# Patient Record
Sex: Male | Born: 1995 | Race: White | Hispanic: No | Marital: Single | Smoking: Never smoker
Health system: Southern US, Community
[De-identification: ages and names within clinical notes are randomized; demographics above are authoritative.]

## PROBLEM LIST (undated history)

## (undated) DIAGNOSIS — F913 Oppositional defiant disorder: Secondary | ICD-10-CM

## (undated) DIAGNOSIS — F909 Attention-deficit hyperactivity disorder, unspecified type: Secondary | ICD-10-CM

## (undated) DIAGNOSIS — F32A Depression, unspecified: Secondary | ICD-10-CM

## (undated) DIAGNOSIS — F329 Major depressive disorder, single episode, unspecified: Secondary | ICD-10-CM

## (undated) DIAGNOSIS — G43909 Migraine, unspecified, not intractable, without status migrainosus: Secondary | ICD-10-CM

## (undated) DIAGNOSIS — Z8619 Personal history of other infectious and parasitic diseases: Secondary | ICD-10-CM

## (undated) DIAGNOSIS — D649 Anemia, unspecified: Secondary | ICD-10-CM

## (undated) DIAGNOSIS — F845 Asperger's syndrome: Secondary | ICD-10-CM

## (undated) HISTORY — DX: Major depressive disorder, single episode, unspecified: F32.9

## (undated) HISTORY — DX: Attention-deficit hyperactivity disorder, unspecified type: F90.9

## (undated) HISTORY — DX: Asperger's syndrome: F84.5

## (undated) HISTORY — DX: Migraine, unspecified, not intractable, without status migrainosus: G43.909

## (undated) HISTORY — PX: OTHER SURGICAL HISTORY: SHX169

## (undated) HISTORY — PX: TONSILLECTOMY: SUR1361

## (undated) HISTORY — PX: WISDOM TOOTH EXTRACTION: SHX21

## (undated) HISTORY — DX: Depression, unspecified: F32.A

## (undated) HISTORY — DX: Anemia, unspecified: D64.9

## (undated) HISTORY — DX: Oppositional defiant disorder: F91.3

## (undated) HISTORY — DX: Personal history of other infectious and parasitic diseases: Z86.19

---
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
## 2015-03-09 ENCOUNTER — Ambulatory Visit (HOSPITAL_COMMUNITY): Payer: Self-pay | Admitting: Psychiatry

## 2015-03-24 ENCOUNTER — Ambulatory Visit (INDEPENDENT_AMBULATORY_CARE_PROVIDER_SITE_OTHER): Payer: BLUE CROSS/BLUE SHIELD | Admitting: Psychiatry

## 2015-03-24 ENCOUNTER — Encounter (HOSPITAL_COMMUNITY): Payer: Self-pay | Admitting: Psychiatry

## 2015-03-24 VITALS — BP 98/58 | HR 84 | Ht 65.0 in | Wt 114.0 lb

## 2015-03-24 DIAGNOSIS — F913 Oppositional defiant disorder: Secondary | ICD-10-CM | POA: Diagnosis not present

## 2015-03-24 DIAGNOSIS — F909 Attention-deficit hyperactivity disorder, unspecified type: Secondary | ICD-10-CM

## 2015-03-24 DIAGNOSIS — F845 Asperger's syndrome: Secondary | ICD-10-CM | POA: Diagnosis not present

## 2015-03-24 DIAGNOSIS — F988 Other specified behavioral and emotional disorders with onset usually occurring in childhood and adolescence: Secondary | ICD-10-CM

## 2015-03-24 MED ORDER — AMPHETAMINE-DEXTROAMPHET ER 25 MG PO CP24: 50.0000 mg | ORAL_CAPSULE | ORAL | Status: DC

## 2015-03-24 MED ORDER — ESCITALOPRAM OXALATE 20 MG PO TABS: 20.0000 mg | ORAL_TABLET | Freq: Every day | ORAL | Status: DC

## 2015-03-24 NOTE — Progress Notes (Signed)
[facility] Progress Note  Semaje Kinker [id number] 20 y.o.  Chief Complaint: I quit college    Diagnosis ------ADHD combined type, autism spectrum disorder and depressive disorder NOS presents for medication  History of Present Illness.--- Patient seen for medications follow-up, states that he dropped out of college because his best friend's father was dying of cancer and he wanted to spend time with him before he died. States that his father is very mad at him so he is presently living with his grandmother. 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 States is going to start next Tuesday.  Patient reports that he takes his medications but the Remeron is making him overly sedated and he wants to discontinue it and wants another medication that does not make him sleepy for his depression. Discussed the rationale risks benefits options of Lexapro and patient gave informed consent will start him on Lexapro 20 mg daily.  States his sleep is good appetite is poor mood has been good.  Denies feeling anxious denies feeling hopeless helpless no suicidal or homicidal ideation no hallucinations or delusions.    Substance abuse--- patient denies using alcohol, marijuana, cigarettes or other drugs.   Review of Systems  Constitutional: Negative.  Negative for fever, chills, weight loss and malaise/fatigue.  HENT: Negative.  Negative for congestion, ear discharge, ear pain, hearing loss, nosebleeds, sore throat and tinnitus.   Eyes: Negative.  Negative for blurred vision, double vision, discharge and redness.  Respiratory: Negative.  Negative for cough, hemoptysis, sputum production, shortness of breath and wheezing.   Cardiovascular: Positive for PND. Negative for chest pain, palpitations, orthopnea, claudication and leg swelling.  Gastrointestinal: Negative.  Negative for heartburn, nausea, vomiting, abdominal pain, diarrhea and constipation.  Genitourinary: Negative.   Negative for dysuria, urgency, frequency, hematuria and flank pain.  Musculoskeletal: Positive for joint pain. Negative for myalgias, back pain, falls and neck pain.  Skin: Negative.  Negative for rash.  Neurological: Negative.  Negative for dizziness, tingling, tremors, sensory change, speech change, focal weakness, seizures, loss of consciousness, weakness and headaches.  Endo/Heme/Allergies: Negative.  Negative for environmental allergies and polydipsia. Does not bruise/bleed easily.  Psychiatric/Behavioral: Negative.  Negative for depression, suicidal ideas, hallucinations, memory loss and substance abuse. The patient is not nervous/anxious and does not have insomnia.     Past Medical Family, Social History: Lives with his family and is in high school  Outpatient Encounter Prescriptions as of 03/24/2015  Medication Sig  . amphetamine-dextroamphetamine (ADDERALL XR) 25 MG 24 hr capsule Take 2 capsules by mouth every morning.  Marland Kitchen amphetamine-dextroamphetamine (ADDERALL XR) 25 MG 24 hr capsule Take 2 capsules by mouth daily.  Marland Kitchen amphetamine-dextroamphetamine (ADDERALL XR) 25 MG 24 hr capsule Take 2 capsules by mouth every morning.  . mirtazapine (REMERON SOL-TAB) 15 MG disintegrating tablet Dissolve 1 tablet on tongue every night at bedtime   No facility-administered encounter medications on file as of 03/24/2015.    Past Psychiatric History/Hospitalization(s): Anxiety: No Bipolar Disorder: No Depression: Yes Mania: No Psychosis: No Schizophrenia: No Personality Disorder: No Hospitalization for psychiatric illness: No History of Electroconvulsive Shock Therapy: No Prior Suicide Attempts: No  Physical Exam: Constitutional:  BP 98/58 mmHg  Pulse 84  Ht  (1.651 m)  Wt 114 lb (51.71 kg)  BMI 18.97 kg/m2  General Appearance: alert, oriented, no acute distress  Musculoskeletal: Strength & Muscle Tone: within normal limits Gait & Station: normal Patient leans:  N/A  Psychiatric: Speech (describe rate,  volume, coherence, spontaneity, and abnormalities if any): Normal in volume, rate, tone, spontaneous   Thought Process (describe rate, content, abstract reasoning, and computation): Organized, goal directed, age appropriate   Associations: Intact  Thoughts: normal  Mental Status: Orientation: oriented to person, place and situation Mood & Affect: Dysphoric and anxious Attention Span & Concentration: Good/good Cognition: Intact Recent and remote memories: Are intact and age-appropriate Insight and judgment: Seems fair at this visit Language: [employer] of knowledge: Fair Suicidal Ideation: No Plan Formed: No Patient has means to carry out plan: No  Homicidal Ideation: No Plan Formed: No Patient has means to carry out plan: No  Medical Decision Making (Choose Three): Established Problem, Stable/Improving (1), Review of Psycho-Social Stressors (1), Review of Last Therapy Session (1) and Review of Medication Regimen & Side Effects (2)  Assessment: Diagnosis  #1 ADHD combined type.  #2 autism spectrum disorder.  #3 ODD. #4 depressive disorder NOS  Plan: #1 Continue Adderall XR 25 mg twice daily for ADHD inattentive type  #2 Depression Start Lexapro 20 mg po q am. DC Remeron SolTab  #3Call when necessary Followup in  3 months This visit was 25 minutes and was up high in density 50% of this visit was spent in discussing medication compliance and also developing organizational skills, making lists of things, medication compliance  Also discussed getting an alarm clock that runs so that patient has to get out of bed in order to shut it down. Discussed putting multiple alarms to remind himself to take medications and he stated understanding. Sleep hygiene was also discussed as patient has a very erratic sleep schedule.    Margit Banda, MD

## 2015-05-12 ENCOUNTER — Encounter: Payer: Self-pay | Admitting: Physician Assistant

## 2015-05-12 ENCOUNTER — Ambulatory Visit (INDEPENDENT_AMBULATORY_CARE_PROVIDER_SITE_OTHER): Payer: BLUE CROSS/BLUE SHIELD | Admitting: Physician Assistant

## 2015-05-12 VITALS — BP 98/70 | HR 116 | Temp 98.4°F | Ht 65.0 in | Wt 118.8 lb

## 2015-05-12 DIAGNOSIS — F988 Other specified behavioral and emotional disorders with onset usually occurring in childhood and adolescence: Secondary | ICD-10-CM

## 2015-05-12 DIAGNOSIS — F909 Attention-deficit hyperactivity disorder, unspecified type: Secondary | ICD-10-CM

## 2015-05-12 NOTE — Progress Notes (Signed)
Pre visit review using our clinic review tool, if applicable. No additional management support is needed unless otherwise documented below in the visit note. 

## 2015-05-12 NOTE — Progress Notes (Signed)
Patient presents to clinic today to establish care. Patient is currently looking for a new job. Is not currently enrolled at school.  Patient with history of Asperger's Disorder, Depression and ODD, and ADHD. Is followed [facility], seeing psychiatrist once every 3 months. Is on a refimen on Lexapro and Adderall XR, which he endorses taking as directed. Denies depressed mood while on medication. Denies SI/HI.  Past Medical History  Diagnosis Date  . ADHD (attention deficit hyperactivity disorder)   . Oppositional defiant disorder   . Asperger's disorder   . Depression   . History of chicken pox   . Anemia   . Migraine     Current Outpatient Prescriptions on File Prior to Visit  Medication Sig Dispense Refill  . amphetamine-dextroamphetamine (ADDERALL XR) 25 MG 24 hr capsule Take 2 capsules by mouth every morning. 60 capsule 0  . escitalopram (LEXAPRO) 20 MG tablet Take 1 tablet (20 mg total) by mouth daily. 30 tablet 2   No current facility-administered medications on file prior to visit.    Allergies  Allergen Reactions  . Vantin Rash    Family History  Problem Relation Age of Onset  . Depression Mother   . ADD / ADHD Father   . Alcohol abuse Maternal Grandfather   . Depression Maternal Grandmother     Social History   Social History  . Marital Status: Single    Spouse Name: N/A  . Number of Children: N/A  . Years of Education: N/A   Social History Main Topics  . Smoking status: Never Smoker   . Smokeless tobacco: None  . Alcohol Use: No  . Drug Use: No  . Sexual Activity: No   Other Topics Concern  . None   Social History Narrative   Review of Systems  Constitutional: Negative for fever and malaise/fatigue.  Eyes: Negative for blurred vision and double vision.  Cardiovascular: Negative for chest pain and palpitations.  Neurological: Negative for headaches.  Psychiatric/Behavioral: Negative for depression, suicidal ideas,  hallucinations and substance abuse. The patient is not nervous/anxious and does not have insomnia.    BP 98/70 mmHg  Pulse 116  Temp(Src) 98.4 F (36.9 C) (Oral)  Ht  (1.651 m)  Wt 118 lb 12.8 oz (53.887 kg)  BMI 19.77 kg/m2  SpO2 98%  Physical Exam  Constitutional: He is oriented to person, place, and time and well-developed, well-nourished, and in no distress.  HENT:  Head: Normocephalic and atraumatic.  Right Ear: External ear normal.  Left Ear: External ear normal.  Nose: Nose normal.  Mouth/Throat: Oropharynx is clear and moist. No oropharyngeal exudate.  Eyes: Conjunctivae are normal. Pupils are equal, round, and reactive to light.  Neck: Neck supple.  Cardiovascular: Regular rhythm, normal heart sounds and intact distal pulses.   Mildly tachycardic  Pulmonary/Chest: Effort normal and breath sounds normal. No respiratory distress. He has no wheezes. He has no rales. He exhibits no tenderness.  Lymphadenopathy:    He has no cervical adenopathy.  Neurological: He is alert and oriented to person, place, and time.  Skin: Skin is warm and dry. No rash noted.  Psychiatric: Affect normal.    No results found for this or any previous visit (from the past 2160 hour(s)).  Assessment/Plan: ADD (attention deficit disorder) Followed by Psychiatry who manages medications. Patient mildly tachycardic today. Endorses being anxious about first visit. Encouraged hydration but reviewed that the Adderall can raise heart rate and to discuss with Psychiatrist  at next visit if heart rate is still elevated.

## 2015-05-12 NOTE — Patient Instructions (Signed)
Welcome to [facility]!  Now that you are established, feel free to come see us any time for sick visits or acute concerns and for things like your yearly physical.  Make sure to follow-up with your specialists as scheduled.

## 2015-05-13 NOTE — Assessment & Plan Note (Signed)
Followed by Psychiatry who manages medications. Patient mildly tachycardic today. Endorses being anxious about first visit. Encouraged hydration but reviewed that the Adderall can raise heart rate and to discuss with Psychiatrist at next visit if heart rate is still elevated.

## 2015-05-26 ENCOUNTER — Emergency Department (HOSPITAL_BASED_OUTPATIENT_CLINIC_OR_DEPARTMENT_OTHER)
Admission: EM | Admit: 2015-05-26 | Discharge: 2015-05-26 | Disposition: A | Discharge status: Other Acute Inpatient Hospital | Payer: BLUE CROSS/BLUE SHIELD | Attending: Emergency Medicine | Admitting: Emergency Medicine

## 2015-05-26 ENCOUNTER — Encounter (HOSPITAL_BASED_OUTPATIENT_CLINIC_OR_DEPARTMENT_OTHER): Payer: Self-pay | Admitting: *Deleted

## 2015-05-26 ENCOUNTER — Inpatient Hospital Stay (HOSPITAL_COMMUNITY)
Admission: AD | Admit: 2015-05-26 | Discharge: 2015-05-31 | DRG: 885 | Disposition: A | Discharge status: Home / Self Care | Payer: BLUE CROSS/BLUE SHIELD | Source: Intra-hospital | Attending: Psychiatry | Admitting: Psychiatry

## 2015-05-26 ENCOUNTER — Encounter (HOSPITAL_COMMUNITY): Payer: Self-pay

## 2015-05-26 DIAGNOSIS — Z8679 Personal history of other diseases of the circulatory system: Secondary | ICD-10-CM | POA: Diagnosis not present

## 2015-05-26 DIAGNOSIS — F1510 Other stimulant abuse, uncomplicated: Secondary | ICD-10-CM | POA: Insufficient documentation

## 2015-05-26 DIAGNOSIS — Z79899 Other long term (current) drug therapy: Secondary | ICD-10-CM | POA: Insufficient documentation

## 2015-05-26 DIAGNOSIS — F32A Depression, unspecified: Secondary | ICD-10-CM

## 2015-05-26 DIAGNOSIS — Z8619 Personal history of other infectious and parasitic diseases: Secondary | ICD-10-CM | POA: Diagnosis not present

## 2015-05-26 DIAGNOSIS — Y9289 Other specified places as the place of occurrence of the external cause: Secondary | ICD-10-CM | POA: Insufficient documentation

## 2015-05-26 DIAGNOSIS — Z862 Personal history of diseases of the blood and blood-forming organs and certain disorders involving the immune mechanism: Secondary | ICD-10-CM | POA: Diagnosis not present

## 2015-05-26 DIAGNOSIS — Y998 Other external cause status: Secondary | ICD-10-CM | POA: Insufficient documentation

## 2015-05-26 DIAGNOSIS — Y9389 Activity, other specified: Secondary | ICD-10-CM | POA: Diagnosis not present

## 2015-05-26 DIAGNOSIS — F339 Major depressive disorder, recurrent, unspecified: Secondary | ICD-10-CM | POA: Diagnosis present

## 2015-05-26 DIAGNOSIS — F988 Other specified behavioral and emotional disorders with onset usually occurring in childhood and adolescence: Secondary | ICD-10-CM

## 2015-05-26 DIAGNOSIS — T1491 Suicide attempt: Secondary | ICD-10-CM | POA: Diagnosis present

## 2015-05-26 DIAGNOSIS — R45851 Suicidal ideations: Secondary | ICD-10-CM | POA: Diagnosis present

## 2015-05-26 DIAGNOSIS — T1491XA Suicide attempt, initial encounter: Secondary | ICD-10-CM

## 2015-05-26 DIAGNOSIS — T50902A Poisoning by unspecified drugs, medicaments and biological substances, intentional self-harm, initial encounter: Secondary | ICD-10-CM | POA: Diagnosis not present

## 2015-05-26 DIAGNOSIS — T39312A Poisoning by propionic acid derivatives, intentional self-harm, initial encounter: Secondary | ICD-10-CM

## 2015-05-26 DIAGNOSIS — F329 Major depressive disorder, single episode, unspecified: Secondary | ICD-10-CM | POA: Insufficient documentation

## 2015-05-26 DIAGNOSIS — F909 Attention-deficit hyperactivity disorder, unspecified type: Secondary | ICD-10-CM | POA: Diagnosis not present

## 2015-05-26 LAB — SALICYLATE LEVEL

## 2015-05-26 LAB — RAPID URINE DRUG SCREEN, HOSP PERFORMED
AMPHETAMINES: POSITIVE — AB
Barbiturates: NOT DETECTED
Benzodiazepines: NOT DETECTED
Cocaine: NOT DETECTED
Opiates: NOT DETECTED
TETRAHYDROCANNABINOL: NOT DETECTED

## 2015-05-26 LAB — COMPREHENSIVE METABOLIC PANEL
ALK PHOS: 88 U/L (ref 38–126)
ALT: 31 U/L (ref 17–63)
AST: 30 U/L (ref 15–41)
Albumin: 5 g/dL (ref 3.5–5.0)
Anion gap: 11 (ref 5–15)
BUN: 14 mg/dL (ref 6–20)
CHLORIDE: 102 mmol/L (ref 101–111)
CO2: 26 mmol/L (ref 22–32)
CREATININE: 0.99 mg/dL (ref 0.61–1.24)
Calcium: 10 mg/dL (ref 8.9–10.3)
GFR calc Af Amer: >60 mL/min (ref >60)
Glucose, Bld: 83 mg/dL (ref 65–99)
Potassium: 4.1 mmol/L (ref 3.5–5.1)
Sodium: 139 mmol/L (ref 135–145)
Total Bilirubin: 1.1 mg/dL (ref 0.3–1.2)
Total Protein: 7.9 g/dL (ref 6.5–8.1)

## 2015-05-26 LAB — CBC
HCT: 45.2 % (ref 39.0–52.0)
HEMOGLOBIN: 15.8 g/dL (ref 13.0–17.0)
MCH: 29.4 pg (ref 26.0–34.0)
MCHC: 35 g/dL (ref 30.0–36.0)
MCV: 84.2 fL (ref 78.0–100.0)
PLATELETS: 285 10*3/uL (ref 150–400)
RBC: 5.37 MIL/uL (ref 4.22–5.81)
RDW: 13.6 % (ref 11.5–15.5)
WBC: 7.1 10*3/uL (ref 4.0–10.5)

## 2015-05-26 LAB — ACETAMINOPHEN LEVEL

## 2015-05-26 LAB — ETHANOL: Alcohol, Ethyl (B): <5 mg/dL (ref <5)

## 2015-05-26 MED ORDER — MAGNESIUM HYDROXIDE 400 MG/5ML PO SUSP: 30.0000 mL | Freq: Every day | ORAL | Status: DC | PRN

## 2015-05-26 MED ORDER — AMPHETAMINE-DEXTROAMPHET ER 10 MG PO CP24
40.0000 mg | ORAL_CAPSULE | Freq: Every day | ORAL | Status: DC
  Administered 2015-05-27 – 2015-05-31 (×3): 40 mg via ORAL
  Filled 2015-05-26 (×4): qty 4

## 2015-05-26 MED ORDER — TRAZODONE HCL 50 MG PO TABS
50.0000 mg | ORAL_TABLET | Freq: Every day | ORAL | Status: DC
  Administered 2015-05-26 – 2015-05-30 (×5): 50 mg via ORAL
  Filled 2015-05-26 (×7): qty 1

## 2015-05-26 MED ORDER — HYDROXYZINE HCL 25 MG PO TABS: 25.0000 mg | ORAL_TABLET | ORAL | Status: DC | PRN

## 2015-05-26 MED ORDER — ESCITALOPRAM OXALATE 20 MG PO TABS
20.0000 mg | ORAL_TABLET | Freq: Every day | ORAL | Status: DC
  Filled 2015-05-26: qty 1

## 2015-05-26 MED ORDER — ALUM & MAG HYDROXIDE-SIMETH 200-200-20 MG/5ML PO SUSP: 30.0000 mL | ORAL | Status: DC | PRN

## 2015-05-26 MED ORDER — AMPHETAMINE-DEXTROAMPHET ER 25 MG PO CP24: 50.0000 mg | ORAL_CAPSULE | ORAL | Status: DC

## 2015-05-26 MED ORDER — AMPHETAMINE-DEXTROAMPHET ER 10 MG PO CP24: 20.0000 mg | ORAL_CAPSULE | Freq: Every day | ORAL | Status: DC

## 2015-05-26 MED ORDER — ACETAMINOPHEN 325 MG PO TABS
650.0000 mg | ORAL_TABLET | Freq: Four times a day (QID) | ORAL | Status: DC | PRN
Start: 2015-05-26 — End: 2015-06-01

## 2015-05-26 MED ORDER — ENSURE ENLIVE PO LIQD
237.0000 mL | Freq: Two times a day (BID) | ORAL | Status: DC
  Administered 2015-05-26 – 2015-05-30 (×7): 237 mL via ORAL

## 2015-05-26 NOTE — [facility] Counselor (Addendum)
Adult Comprehensive Assessment  Patient ID: Terry Hutchinson, male   DOB: 1995-04-12, 20 y.o.   MRN: [id number]  Information Source: Information source: Patient  Current Stressors:  Educational / Learning stressors: Left college after a month last year Employment / Job issues: Unemployed and his parents are frustrated with this Family Relationships: conflict with parents triggered suicide attempt Museum/gallery curator / Lack of resources (include bankruptcy): No income at this time Housing / Lack of housing: Wants to find a job so he can have his own place Physical health (include injuries & life threatening diseases): Asperger's diagnosis Social relationships: Limited social interation but reports he has friends Substance abuse: None reported Bereavement / Loss: None reported  Living/Environment/Situation:  Living Arrangements: Parent Living conditions (as described by patient or guardian): all needs met; safe How long has patient lived in current situation?: several months What is atmosphere in current home: Supportive (Bored)  Family History:  Marital status: Single Does patient have children?: No  Childhood History:  By whom was/is the patient raised?: Both parents Description of patient's relationship with caregiver when they were a child: good relationship with parents growing up; father worked a lot Patient's description of current relationship with people who raised him/her: relationship is closer now Does patient have siblings?: Yes Number of Siblings: 2 Description of patient's current relationship with siblings: good relationship with siblings- both live in the home Did patient suffer any verbal/emotional/physical/sexual abuse as a child?: No Did patient suffer from severe childhood neglect?: No Has patient ever been sexually abused/assaulted/raped as an adolescent or adult?: No Was the patient ever a victim of a crime or a disaster?: No Witnessed domestic violence?: No  Education:   Highest grade of school patient has completed: 17 Currently a student?: No Learning disability?: Yes What learning problems does patient have?: Asberger's  Employment/Work Situation:   Employment situation: Unemployed What is the longest time patient has a held a job?: a few months Where was the patient employed at that time?: Retail Has patient ever been in the [employer]?: No Has patient ever served in combat?: No Did You Receive Any Psychiatric Treatment/Services While in Passenger transport manager?: No Are There Guns or Other Weapons in [place]?: No  Financial Resources:   Museum/gallery curator resources: Support from parents / caregiver Does patient have a Programmer, applications or guardian?: No  Alcohol/Substance Abuse:   What has been your use of drugs/alcohol within the last 12 months?: Pt denies If attempted suicide, did drugs/alcohol play a role in this?: No Alcohol/Substance Abuse Treatment Hx: Denies past history Has alcohol/substance abuse ever caused legal problems?: No  Social Support System:   Pensions consultant Support System: Manufacturing engineer System: parents and friends are supportive  Type of faith/religion: unknown How does patient's faith help to cope with current illness?: n/a  Leisure/Recreation:   Leisure and Hobbies: playing video games  Strengths/Needs:   What things does the patient do well?: computers In what areas does patient struggle / problems for patient: social interaction  Discharge Plan:   Does patient have access to transportation?: Yes Will patient be returning to same living situation after discharge?: Yes Currently receiving community mental health services: Yes (From Whom) [facility]) If no, would patient like referral for services when discharged?: No Does patient have financial barriers related to discharge medications?: No  Summary/Recommendations:     Patient is a 20 year old male with a diagnosis of Major Depressive Disorder and  Asperger's by hx . Pt presented to the hospital  after an intentional overdose which Pt reports was impulsive. Pt reports primary trigger(s) for admission was conflict with his parents and boredom. Patient will benefit from crisis stabilization, medication evaluation, group therapy and psycho education in addition to case management for discharge planning. At discharge it is recommended that Pt remain compliant with established discharge plan and continued treatment.    Terry Hutchinson. 05/26/2015

## 2015-05-26 NOTE — ED Notes (Signed)
Assumed care of patient from Amy RN.

## 2015-05-26 NOTE — Tx Team (Signed)
Initial Interdisciplinary Treatment Plan   PATIENT STRESSORS: Marital or family conflict   PATIENT STRENGTHS: Average or above average intelligence General fund of knowledge   PROBLEM LIST: Problem List/Patient Goals Date to be addressed Date deferred Reason deferred Estimated date of resolution  Depression 05/26/15     Suicide attempt 05/26/15     "I only came here because I was told to" 05/26/15     "To go home tomorrow" 05/26/15                                    DISCHARGE CRITERIA:  Need for constant or close observation no longer present Verbal commitment to aftercare and medication compliance  PRELIMINARY DISCHARGE PLAN: Outpatient therapy Medication management  PATIENT/FAMIILY INVOLVEMENT: This treatment plan has been presented to and reviewed with the patient, Arnoldo HookerMicah Riel.  The patient and family have been given the opportunity to ask questions and make suggestions.  Norm ParcelHeather V Adina Puzzo 05/26/2015, 12:51 PM

## 2015-05-26 NOTE — ED Notes (Signed)
Patient eating cereal and crackers, patient's mother and sitter at bedside.

## 2015-05-26 NOTE — ED Notes (Signed)
This rn calls Beth of [facility] to clarify recommendations for observation. Beth recommends 4 hour observation post ingestion for n/v and monitoring vs. Ingestion occurred at 0745, pt [facility]. [facility] with [facility]. Will send pt to his assigned room via [facility] at 11:45.
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
## 2015-05-26 NOTE — ED Notes (Signed)
Staffing notified of need for sitter, Minerva Areolaric states no sitter available until possibly 3pm. Dawn, EMT will sit with pt.

## 2015-05-26 NOTE — ED Notes (Signed)
Computer placed at bedside for TTS assessment. Report to DotseroKaila, Charity fundraiserN.

## 2015-05-26 NOTE — ED Notes (Addendum)
Patient sleeping, patient's mother stepped out to waiting room to speak with patient's grandparents, sitter at bedside.

## 2015-05-26 NOTE — ED Notes (Signed)
Patient given warm blanket, currently sleeping, mother at bedside, room secured.

## 2015-05-26 NOTE — H&P (Signed)
Psychiatric Admission Assessment Adult  Patient Identification: Terry Hutchinson MRN:  [id number] Date of Evaluation:  05/26/2015 Chief Complaint:  " I tried to commit suicide " Principal Diagnosis:  Suicide Attempt by Overdose  Diagnosis:   Patient Active Problem List   Diagnosis Date Noted  . Major depressive disorder, recurrent episode ([place]) [F33.9] 05/26/2015  . ADD (attention deficit disorder) [F90.9] 02/20/2011  . Asperger's disorder [F84.5] 02/20/2011  . ODD (oppositional defiant disorder) [F91.3] 02/20/2011   History of Present Illness:: 20 year old male, reports recent impulsive overdose on Naprosyn.  Reports " I  think I took about 20 pills "States that this was not planned out, " spontaneous". States " I have been sad, but I would not say that depressed ". States that a triggering event might have been a discussion with his parents , which he states " I don't even know what it was about, something stupid, not a big thing".  States that argument with parents centered around their wanting him to be more active, " get out of bed, look for a job"  Patient states he had left college late last year and has since been at home. States that he is unemployed. Admits he spends most of his time playing video games, and states he understands his parents are concerned about this. He also states he feels bored often, as he has limited daily structure.  Overdose was witnessed by parents and was brought to hospital . States " I really regret overdosing, look what happened , I ended up in the hospital". Associated Signs/Symptoms: Depression Symptoms:  insomnia, decreased appetite, of note, denies anhedonia, denies pervasive sense of sadness, denies any recent thoughts of suicide, denies any changes in energy, states he has not been sleeping well and his appetite is poor dur to Adderall in part  (Hypo) Manic Symptoms:  denies Anxiety Symptoms:  Denies panic attacks, denies agoraphobia, denies social  phobia Psychotic Symptoms:  Denies  PTSD Symptoms: Denies  Total Time spent with patient: 45 minutes  Past Psychiatric History: denies any prior psychiatric admissions, states he has never attempted suicide before, and denies any history of self cutting or self injurious behaviors, minimizes prior history of depression and states he does not think he is prone to depression .  States he has been diagnosed with ADHD and has been of Adderall for years . States " it does help me to function". He states he has been diagnosed with Asperger's in the past .  Of note, states he was started on Lexapro x 1 month ago, because " I said I was kind of bored ".    Is the patient at risk to self? Yes.    Has the patient been a risk to self in the past 6 months? No.  Has the patient been a risk to self within the distant past? No.  Is the patient a risk to others? No.  Has the patient been a risk to others in the past 6 months? No.  Has the patient been a risk to others within the distant past? No.   Prior Inpatient Therapy:  denies  Prior Outpatient Therapy:  follows up at [facility] outpatient   Alcohol Screening: 1. How often do you have a drink containing alcohol?: Never 9. Have you or someone else been injured as a result of your drinking?: No 10. Has a relative or friend or a doctor or another health worker been concerned about your drinking or suggested you cut down?: No  Alcohol Use Disorder Identification Test Final Score (AUDIT): 0 Brief Intervention: AUDIT score less than 7 or less-screening does not suggest unhealthy drinking-brief intervention not indicated Substance Abuse History in the last 12 months:   Denies any drug or alcohol abuse . States he is prescribed Adderall but denies abusing it and states he takes less than prescribed  Consequences of Substance Abuse: Denies  Previous Psychotropic Medications:  Adderall for ADHD , he has been on Lexapro x 1 month . Psychological Evaluations:  No   Past Medical History:  Past Medical History  Diagnosis Date  . ADHD (attention deficit hyperactivity disorder)   . Oppositional defiant disorder   . Asperger's disorder   . Depression   . History of chicken pox   . Anemia   . Migraine     Past Surgical History  Procedure Laterality Date  . Tubes in ears      in the past  . Tonsillectomy    . Wisdom tooth extraction     Family History: parents alive, live together, patient living with them at this time , has two older siblings . Family History  Problem Relation Age of Onset  . Depression Mother   . ADD / ADHD Father   . Alcohol abuse Maternal Grandfather   . Depression Maternal Grandmother    Family Psychiatric  History: denies any history of mental illness, denies any history of suicides, denies history of substance abuse in family  Tobacco Screening: does not smoke  Social History:  Single, no children, lives with parents, had been in college up to last year, but states he was not doing well academically . Recent break up with GF, but does not feel this is a stressor because " I was the one who wanted to break up". Currently unemployed, states he is looking for a job. Denies legal issues .  History  Alcohol Use No     History  Drug Use No    Additional Social History:      Pain Medications: Pt denies Prescriptions: Lexapro, Adderall Over the Counter: Pt denies History of alcohol / drug use?: No history of alcohol / drug abuse Longest period of sobriety (when/how long): NA  Allergies:   Allergies  Allergen Reactions  . Vantin Rash   Lab Results:  Results for orders placed or performed during the hospital encounter of 05/26/15 (from the past 48 hour(s))  CBC     Status: None   Collection Time: 05/26/15  8:30 AM  Result Value Ref Range   WBC 7.1 4.0 - 10.5 K/uL   RBC 5.37 4.22 - 5.81 MIL/uL   Hemoglobin 15.8 13.0 - 17.0 g/dL   HCT 45.2 39.0 - 52.0 %   MCV 84.2 78.0 - 100.0 fL   MCH 29.4 26.0 - 34.0 pg    MCHC 35.0 30.0 - 36.0 g/dL   RDW 13.6 11.5 - 15.5 %   Platelets 285 150 - 400 K/uL  Comprehensive metabolic panel     Status: None   Collection Time: 05/26/15  8:30 AM  Result Value Ref Range   Sodium 139 135 - 145 mmol/L   Potassium 4.1 3.5 - 5.1 mmol/L   Chloride 102 101 - 111 mmol/L   CO2 26 22 - 32 mmol/L   Glucose, Bld 83 65 - 99 mg/dL   BUN 14 6 - 20 mg/dL   Creatinine, Ser 0.99 0.61 - 1.24 mg/dL   Calcium 10.0 8.9 - 10.3 mg/dL   Total Protein 7.9  6.5 - 8.1 g/dL   Albumin 5.0 3.5 - 5.0 g/dL   AST 30 15 - 41 U/L   ALT 31 17 - 63 U/L   Alkaline Phosphatase 88 38 - 126 U/L   Total Bilirubin 1.1 0.3 - 1.2 mg/dL   GFR calc non Af Amer >60 >60 mL/min   GFR calc Af Amer >60 >60 mL/min    Comment: (NOTE) The eGFR has been calculated using the CKD EPI equation. This calculation has not been validated in all clinical situations. eGFR's persistently <60 mL/min signify possible Chronic Kidney Disease.    Anion gap 11 5 - 15  Ethanol     Status: None   Collection Time: 05/26/15  8:30 AM  Result Value Ref Range   Alcohol, Ethyl (B) <5 <5 mg/dL    Comment:        LOWEST DETECTABLE LIMIT FOR SERUM ALCOHOL IS 5 mg/dL FOR MEDICAL PURPOSES ONLY   Salicylate level     Status: None   Collection Time: 05/26/15  8:30 AM  Result Value Ref Range   Salicylate Lvl <4.0 2.8 - 30.0 mg/dL  Acetaminophen level     Status: Abnormal   Collection Time: 05/26/15  8:30 AM  Result Value Ref Range   Acetaminophen (Tylenol), Serum <10 (L) 10 - 30 ug/mL    Comment:        THERAPEUTIC CONCENTRATIONS VARY SIGNIFICANTLY. A RANGE OF 10-30 ug/mL MAY BE AN EFFECTIVE CONCENTRATION FOR MANY PATIENTS. HOWEVER, SOME ARE BEST TREATED AT CONCENTRATIONS OUTSIDE THIS RANGE. ACETAMINOPHEN CONCENTRATIONS >150 ug/mL AT 4 HOURS AFTER INGESTION AND >50 ug/mL AT 12 HOURS AFTER INGESTION ARE OFTEN ASSOCIATED WITH TOXIC REACTIONS.   Urine rapid drug screen (hosp performed)     Status: Abnormal   Collection  Time: 05/26/15 10:45 AM  Result Value Ref Range   Opiates NONE DETECTED NONE DETECTED   Cocaine NONE DETECTED NONE DETECTED   Benzodiazepines NONE DETECTED NONE DETECTED   Amphetamines POSITIVE (A) NONE DETECTED   Tetrahydrocannabinol NONE DETECTED NONE DETECTED   Barbiturates NONE DETECTED NONE DETECTED    Comment:        DRUG SCREEN FOR MEDICAL PURPOSES ONLY.  IF CONFIRMATION IS NEEDED FOR ANY PURPOSE, NOTIFY LAB WITHIN 5 DAYS.        LOWEST DETECTABLE LIMITS FOR URINE DRUG SCREEN Drug Class       Cutoff (ng/mL) Amphetamine      1000 Barbiturate      200 Benzodiazepine   981 Tricyclics       191 Opiates          300 Cocaine          300 THC              50     Blood Alcohol level:  Lab Results  Component Value Date   ETH <5 47/82/9562    Metabolic Disorder Labs:  No results found for: HGBA1C, MPG No results found for: PROLACTIN No results found for: CHOL, TRIG, HDL, CHOLHDL, VLDL, LDLCALC  Current Medications: Current Facility-Administered Medications  Medication Dose Route Frequency Provider Last Rate Last Dose  . acetaminophen (TYLENOL) tablet 650 mg  650 mg Oral Q6H PRN Encarnacion Slates, NP      . alum & mag hydroxide-simeth (MAALOX/MYLANTA) 200-200-20 MG/5ML suspension 30 mL  30 mL Oral Q4H PRN Encarnacion Slates, NP      . feeding supplement (ENSURE ENLIVE) (ENSURE ENLIVE) liquid 237 mL  237 mL Oral BID BM  Jenne Campus, MD      . hydrOXYzine (ATARAX/VISTARIL) tablet 25 mg  25 mg Oral Q4H PRN Encarnacion Slates, NP      . magnesium hydroxide (MILK OF MAGNESIA) suspension 30 mL  30 mL Oral Daily PRN Encarnacion Slates, NP      . traZODone (DESYREL) tablet 50 mg  50 mg Oral QHS Encarnacion Slates, NP       PTA Medications: Prescriptions prior to admission  Medication Sig Dispense Refill Last Dose  . amphetamine-dextroamphetamine (ADDERALL XR) 25 MG 24 hr capsule Take 2 capsules by mouth every morning. 60 capsule 0 Taking  . escitalopram (LEXAPRO) 20 MG tablet Take 1 tablet (20 mg  total) by mouth daily. 30 tablet 2 Taking    Musculoskeletal: Strength & Muscle Tone: within normal limits Gait & Station: normal Patient leans: N/A  Psychiatric Specialty Exam: Physical Exam  Review of Systems  Constitutional: Negative.   HENT: Negative.   Eyes: Negative.   Respiratory: Negative.   Cardiovascular: Negative.   Gastrointestinal: Negative for heartburn, nausea, vomiting, abdominal pain and blood in stool.  Genitourinary: Negative.   Musculoskeletal: Negative.   Skin: Negative.   Neurological: Negative for seizures.  Psychiatric/Behavioral: Positive for suicidal ideas.  All other systems reviewed and are negative.   Blood pressure 96/47, pulse 114, temperature 97.8 F (36.6 C), temperature source Oral, resp. rate 16, height _0  (1.676 m), weight 116 lb (52.617 kg).Body mass index is 18.73 kg/(m^2).  General Appearance: Well Groomed  Engineer, water::  Good  Speech:  Normal Rate  Volume:  Normal  Mood:  at this time patient minimizes depression, states " I am OK"  Affect:  Appropriate and reactive   Thought Process:  Linear  Orientation:  Full (Time, Place, and Person)  Thought Content:  Denies hallucinations, no delusions, not internally preoccupied   Suicidal Thoughts:  No- at this time denies any suicidal ideations, denies any self injurious ideations , contracts for safety   Homicidal Thoughts:  No  Memory:  recent and remote grossly intact   Judgement:  Fair  Insight:  Fair  Psychomotor Activity:  Normal  Concentration:  Good  Recall:  Good  Fund of Knowledge:Good  Language: Good  Akathisia:  Negative  Handed:  Right  AIMS (if indicated):     Assets:  Communication Skills Desire for Improvement Resilience Social Support  ADL's:  Intact  Cognition: WNL  Sleep:        Treatment Plan Summary: Daily contact with patient to assess and evaluate symptoms and progress in treatment, Medication management, Plan inpatient treatment  and medications as  below   Observation Level/Precautions:  15 minute checks  Laboratory:  as needed - will check EKG   Psychotherapy:  Milieu, support   Medications:  We discussed medication issues- had been on Lexapro over recent weeks- as minimizes depression and there is concern that lexapro might be a contributor to increased impulsivity, will not renew. Continue Adderall XR at 40 mgrs QAM for history of ADHD -  he had been taking 50 mgrs a day prior to admission - states this medication helps . Denies side effects .   Consultations:  As needed   Discharge Concerns:  -  Estimated LOS: 4 days   Other:  Patient encouraged to provide consent to communicate with parents    I certify that inpatient services furnished can reasonably be expected to improve the patient's condition.    Neita Garnet, MD 4/5/20172:15 PM

## 2015-05-26 NOTE — ED Notes (Signed)
MD at bedside. 

## 2015-05-26 NOTE — ED Notes (Signed)
Dr Patria Maneampos states that patient does not need ordered meds urgently and there is no need to have a carrier [facility] them [facility].

## 2015-05-26 NOTE — ED Notes (Signed)
TTS consult computer at bedside with counselor speaking to patient, mother and sitter stepped out per counselor's request.
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
## 2015-05-26 NOTE — ED Notes (Signed)
Terry Hutchinson, Pharmacy Tech [facility] at [facility] states we do not keep Lexapro and Adderall here, that she will have to contact EDP and [facility] to have a paid carrier bring the medications over.

## 2015-05-26 NOTE — [facility] Suicide Risk Assessment (Signed)
[facility] INPATIENT:  Family/Significant Other Suicide Prevention Education  Suicide Prevention Education:  Education Completed; Wille Celesteonya Mccary, Pt's mother [phone], has been identified by the patient as the family member/significant other with whom the patient will be residing, and identified as the person(s) who will aid the patient in the event of a mental health crisis (suicidal ideations/suicide attempt).  With written consent from the patient, the family member/significant other has been provided the following suicide prevention education, prior to the and/or following the discharge of the patient.  The suicide prevention education provided includes the following:  Suicide risk factors  Suicide prevention and interventions  National Suicide Hotline telephone number  [facility] assessment telephone number  [facility] Emergency Assistance 911  [facility] and/or Residential Mobile Crisis Unit telephone number  Request made of family/significant other to:  Remove weapons (e.g., guns, rifles, knives), all items previously/currently identified as safety concern.    Remove drugs/medications (over-the-counter, prescriptions, illicit drugs), all items previously/currently identified as a safety concern.  The family member/significant other verbalizes understanding of the suicide prevention education information provided.  The family member/significant other agrees to remove the items of safety concern listed above.  Elaina Hoopsarter, Kerwin Augustus M 05/26/2015, 4:21 PM

## 2015-05-26 NOTE — Progress Notes (Addendum)
[facility], attending is Dr Jama Flavorsobos. Number for report is [phone]. Pending medical clearance  Ilean SkillMeghan Dewanna Hurston, MSW, LCSW Clinical Social Work, Disposition  05/26/2015 [phone]

## 2015-05-26 NOTE — ED Notes (Signed)
Pt sleeping, unable to urinate at this time. States he is hungry, Sprite and Crackers provided.

## 2015-05-26 NOTE — [facility] Suicide Risk Assessment (Signed)
[facility]   Nursing information obtained from:  Patient and chart Demographic factors:  Male, Adolescent or young adult, Caucasian- lives with parents, unemployed  Current Mental Status:  Self-harm behaviors see below Loss Factors:  Lack of daily structure, tension with parents as unemployed and not in college  Historical Factors:  Has been diagnosed with  Asperger's Disorder  and with ADHD in the past , denies prior suicide attempts or prior psychiatric admissions  Risk Reduction Factors:  Living with another person, especially a relative  Total Time spent with patient: 45 minutes Principal Problem: Suicide attempt by drug ingestion (HCC) Diagnosis:   Patient Active Problem List   Diagnosis Date Noted  . Major depressive disorder, recurrent episode (HCC) [F33.9] 05/26/2015  . Suicide attempt by drug ingestion (HCC) [T50.902A]   . ADD (attention deficit disorder) [F90.9] 02/20/2011  . Asperger's disorder [F84.5] 02/20/2011  . ODD (oppositional defiant disorder) [F91.3] 02/20/2011    Continued Clinical Symptoms:  Alcohol Use Disorder Identification Test Final Score (AUDIT): 0 The "Alcohol Use Disorders Identification Test", Guidelines for Use in Primary Care, Second Edition.  World Science writerHealth Organization [facility](WHO). Score between 0-7:  no or low risk or alcohol related problems. Score between 8-15:  moderate risk of alcohol related problems. Score between 16-19:  high risk of alcohol related problems. Score 20 or above:  warrants further diagnostic evaluation for alcohol dependence and treatment.   CLINICAL FACTORS:  20 year old single male, living with parents, states he stopped going to college late last year, returned home, at this time not employed. States there has been some tension with his parents about this. Earlier today impulsively overdosed on OTC ( NSAID) during an argument with his parents. Minimizes depression, denies neuro-vegetative symptoms of  depression or prior suicidal ideations . Denies drug or alcohol abuse .    Psychiatric Specialty Exam: ROS  Blood pressure 96/47, pulse 114, temperature 97.8 F (36.6 C), temperature source Oral, resp. rate 16, height 5\' 6"  (1.676 m), weight 116 lb (52.617 kg).Body mass index is 18.73 kg/(m^2).   see admit note MSE  COGNITIVE FEATURES THAT CONTRIBUTE TO RISK:  Closed-mindedness and Loss of executive function    SUICIDE RISK:   Moderate:  Frequent suicidal ideation with limited intensity, and duration, some specificity in terms of plans, no associated intent, good self-control, limited dysphoria/symptomatology, some risk factors present, and identifiable protective factors, including available and accessible social support.  PLAN OF CARE: Patient will be admitted to inpatient psychiatric unit for stabilization and safety. Will provide and encourage milieu participation. Provide medication management and maked adjustments as needed.  Will follow daily.    I certify that inpatient services furnished can reasonably be expected to improve the patient's condition.   Nehemiah MassedOBOS, FERNANDO, MD 05/26/2015, 2:56 PM

## 2015-05-26 NOTE — ED Notes (Signed)
Sitter & patient's mother return to bedside.

## 2015-05-27 NOTE — Tx Team (Signed)
Interdisciplinary Treatment Plan Update (Adult) Date: 05/27/2015   Date: 05/27/2015 9:10 AM  Progress in Treatment:  Attending groups: Pt is new to milieu, continuing to assess  Participating in groups: Pt is new to milieu, continuing to assess  Taking medication as prescribed: Yes  Tolerating medication: Yes  Family/Significant othe contact made: Yes, with mother Patient understands diagnosis: Continuing to assess; currently minimizing overdose Discussing patient identified problems/goals with staff: Yes  Medical problems stabilized or resolved: Yes  Denies suicidal/homicidal ideation: Yes Patient has not harmed self or Others: Yes   New problem(s) identified: None identified at this time.   Discharge Plan or Barriers: Pt will return home and follow-up with [facility]  Additional comments:  Patient and CSW reviewed pt's identified goals and treatment plan. Patient verbalized understanding and agreed to treatment plan. CSW reviewed [facility] "Discharge Process and Patient Involvement" Form. Pt verbalized understanding of information provided and signed form.   Reason for Continuation of Hospitalization:  Depression Medication stabilization Suicidal ideation  Estimated length of stay: 3-5 days  Review of initial/current patient goals per problem list:   1.  Goal(s): Patient will participate in aftercare plan  Met:  Yes  Target date: 3-5 days from date of admission   As evidenced by: Patient will participate within aftercare plan AEB aftercare provider and housing plan at discharge being identified.   05/27/15:  Pt will return home and follow-up with [facility]  2.  Goal (s): Patient will exhibit decreased depressive symptoms and suicidal ideations.  Met:  Progressing  Target date: 3-5 days from date of admission   As evidenced by: Patient will utilize self rating of depression at 3 or below and demonstrate decreased signs of depression or be deemed stable for  discharge by MD.  05/27/15: Pt reports improvement in depressive symptoms since admission.  Attendees:  Patient:    Family:    Physician: Dr. Parke Poisson, MD  05/27/2015 9:10 AM  Nursing: Lars Pinks, RN Case manager  05/27/2015 9:10 AM  Clinical Social Worker Peri Maris, [place] 05/27/2015 9:10 AM  Other: Tilden Fossa, LCSWA 05/27/2015 9:10 AM  Clinical:  Darrol Angel, RN; Eulogio Bear, RN 05/27/2015 9:10 AM  Other: , RN Charge Nurse 05/27/2015 9:10 AM  Other: Hilda Lias, [place], [place] Social Work [phone]

## 2015-05-27 NOTE — Progress Notes (Signed)
Pt is new to the unit this afternoon.  At the beginning of the shift, he was observed in the dayroom watching TV.  He reports he feels safe and understands that he should inform staff if he has any needs or concerns.  He denies SI/HI/AVH at this time.  He did ask Clinical research associatewriter about having his personal pillow on the unit, and pt encouraged him to speak to the MD in the AM.  Writer observed that pt had been in the other bed in the room.  He said, "I was just trying both of them out".  Pt was informed that another pt was assigned to the other bed and would be coming later.  Writer had housekeeping clean the bed again, but when writer went to check on pt later, he had gotten into the other bed(after it was cleaned for the next patient).  Writer spoke to PA to obtain a No roommate order as pt seems to have boundary issues.  Support and encouragement offered.  Encouraged to make his needs known to staff.  Discharge plans are in process.  Safety maintained with q15 minute checks.

## 2015-05-27 NOTE — Progress Notes (Signed)
DAR: Pt present with bright affect on the unit today. Pt has been visible in the day and has been watching TV with peers. Pt has denied any physical pain, took his medications scheduled. Pt stated he slept well last night and has no complains. Pt's safety ensured with 15 minute and environmental checks. Pt currently denies SI/HI and A/V hallucinations. Pt verbally agrees to seek staff if SI/HI or A/VH occurs and to consult with staff before acting on these thoughts. Will continue POC.
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
## 2015-05-27 NOTE — Progress Notes (Signed)
Nutrition Brief Note  Patient identified on the Malnutrition Screening Tool (MST) Report  Patient's weight is stable.  Wt Readings from Last 15 Encounters:  05/26/15 116 lb (52.617 kg) (2 %*, Z = -2.04)  05/26/15 119 lb 12.8 oz (54.341 kg) (4 %*, Z = -1.78)  05/12/15 118 lb 12.8 oz (53.887 kg) (3 %*, Z = -1.84)  03/24/15 114 lb (51.71 kg) (2 %*, Z = -2.16)  10/29/14 118 lb 12.8 oz (53.887 kg) (4 %*, Z = -1.75)  09/13/14 120 lb (54.432 kg) (5 %*, Z = -1.65)  06/11/14 120 lb (54.432 kg) (6 %*, Z = -1.59)  03/12/14 112 lb 12.8 oz (51.166 kg) (2 %*, Z = -2.03)  10/02/13 116 lb (52.617 kg) (5 %*, Z = -1.67)  08/28/13 115 lb 9.6 oz (52.436 kg) (5 %*, Z = -1.67)  05/26/13 117 lb 3.2 oz (53.162 kg) (7 %*, Z = -1.47)  02/11/13 108 lb 3.2 oz (49.079 kg) (2 %*, Z = -1.98)  12/05/12 108 lb 3.2 oz (49.079 kg) (3 %*, Z = -1.90)  08/27/12 107 lb 12.8 oz (48.898 kg) (4 %*, Z = -1.79)  05/28/12 110 lb 3.2 oz (49.986 kg) (7 %*, Z = -1.50)   * Growth percentiles are based on CDC 2-20 Years data.    Body mass index is 18.73 kg/(m^2). Patient meets criteria for normal range based on current BMI.   Diet Order: Diet regular Room service appropriate?: Yes; Fluid consistency:: Thin Pt is also offered choice of unit snacks mid-morning and mid-afternoon.  Pt is eating as desired.   Labs and medications reviewed.   No nutrition interventions warranted at this time. If nutrition issues arise, please consult RD.   Tilda FrancoLindsey Treveon Bourcier, MS, RD, LDN Pager: [phone] After Hours Pager: [phone]

## 2015-05-27 NOTE — Progress Notes (Signed)
Adult Psychoeducational Group Note  Date:  05/27/2015 Time:  9:17 PM  Group Topic/Focus:  Wrap-Up Group:   The focus of this group is to help patients review their daily goal of treatment and discuss progress on daily workbooks.  Participation Level:  Active  Participation Quality:  Appropriate  Affect:  Appropriate  Cognitive:  Alert  Insight: Appropriate  Engagement in Group:  Engaged  Modes of Intervention:  Discussion  Additional Comments:  Patient goal for today was to talk to his dad. Patient stated he met his goal. On a scale between 1-10, (1=worse, 10=best) patient rated his day an 8.  Terry Hutchinson [name] 05/27/2015, 9:17 PM

## 2015-05-27 NOTE — [facility] Group Notes (Signed)
[facility] 05/27/2015 1:15pm  Type of Therapy: Mental Health Association Presentation  Participation Level: Active  Participation Quality: Attentive  Affect: Appropriate  Cognitive: Oriented  Insight: Developing/Improving  Engagement in Therapy: Engaged  Modes of Intervention: Discussion, Education and Socialization  Summary of Progress/Problems: Mental Health Association (MHA) Speaker came to talk about his personal journey with substance abuse and addiction. The pt processed ways by which to relate to the speaker. MHA speaker provided handouts and educational information pertaining to groups and services offered by [facility]. Pt was engaged in speaker's presentation and was receptive to resources provided.    Chad CordialLauren Carter, LCSWA 05/27/2015 1:39 PM

## 2015-05-28 MED ORDER — OLANZAPINE 5 MG PO TABS
5.0000 mg | ORAL_TABLET | Freq: Every day | ORAL | Status: DC
  Administered 2015-05-28 – 2015-05-30 (×3): 5 mg via ORAL
  Filled 2015-05-28 (×6): qty 1

## 2015-05-28 NOTE — BHH Group Notes (Signed)
[facility] LCSW Group Therapy 05/28/2015 1:15pm  Type of Therapy: Group Therapy- Feelings Around Relapse and Recovery  Participation Level: Minimal  Participation Quality:  Reserved  Affect:  Flat  Cognitive: Alert and Oriented   Insight:  Unable to Assess  Engagement in Therapy: Limited  Modes of Intervention: Clarification, Confrontation, Discussion, Education, Exploration, Limit-setting, Orientation, Problem-solving, Rapport Building, Dance movement psychotherapisteality Testing, Socialization and Support  Summary of Progress/Problems: The topic for today was feelings about relapse. The group discussed what relapse prevention is to them and identified triggers that they are on the path to relapse. Members also processed their feeling towards relapse and were able to relate to common experiences. Group also discussed coping skills that can be used for relapse prevention.  Pt did not participate in group discussion; however was attentive to discussion.   Therapeutic Modalities:   Cognitive Behavioral Therapy Solution-Focused Therapy Assertiveness Training Relapse Prevention Therapy    Terry SprinklesLauren Carter, LCSWA [phone] 05/28/2015 4:13 PM

## 2015-05-28 NOTE — Progress Notes (Addendum)
Terry Hutchinson refused to get up this morning and take his scheduled aderrall. He layed in his bed with the covers pulled over his head from 0700-1200. He just now completed his daily assessment and on it he wrote he denied experiencing SI today and he rated his  Depression , hopelessness and anxiety " 0/0/0/", respectively. A He says " have you got my pillow yet have you got my pillow yet..I  need my pillow..  dr said i could have my pillow". Pt presented to this writer holding his patient handbook in his hand and he points to an underlined section " allowed with nurse's permission" and this nurse asks him- if he needs the pillow to sleep at night and he says " well....yeah....have you felt one of the pillows here..I would   be crazy to say "no"..." I'm just bored here..I need something to do" !!!!!. He says " I need the pillow to sleep.I'm having trouble sleeping here. I'm bored".A per MD oder, [facility] ( [place]) and pillow found in patient's locker, wanded by security ( no contraband found) and then physically palpated by this RN ( nothing found)  And then given to patient. He is seen ( 5 min later) sitting in the dayroom with pillow behind his neck, under his head, as he sits straight up and watches TV. He did complete daily assessment and on it he wrote  He deneid SI and he rated his depression, hopelessness and anxitey " 0/0/0", respectively.

## 2015-05-28 NOTE — [facility] Group Notes (Signed)
[facility]  05/28/2015 8:45 AM  Pt did not attend, declined invitation.   Chad CordialLauren Carter, LCSWA 05/28/2015 9:50 AM

## 2015-05-28 NOTE — Progress Notes (Signed)
Adult Psychoeducational Group Note  Date:  05/28/2015 Time:  9:40 PM  Group Topic/Focus:  Wrap-Up Group:   The focus of this group is to help patients review their daily goal of treatment and discuss progress on daily workbooks.  Participation Level:  Active  Participation Quality:  Appropriate  Affect:  Appropriate  Cognitive:  Alert  Insight: Appropriate  Engagement in Group:  Engaged  Modes of Intervention:  Discussion  Additional Comments:  Patient stated having a pleasant day. Patient goal for today was to discharge. Patient stated he met with the doctor and will be discharging on Monday.  Erika Hussar L Davene Jobin 05/28/2015, 9:40 PM

## 2015-05-28 NOTE — Progress Notes (Signed)
Pt reports he had a good day.  He was looking for some things that were brought to him from home, namely a pillow and a board game.  Pt said he had spoken to the MD about having the pillow, but no order for the pillow was entered into the pt's MAR.  Also the board game had many small pieces in it, so staff felt it would not be appropriate for the hall.  Pt was encouraged to ask the MD to write an order tomorrow for his pillow.  Pt was frustrated, but compliant with staff decision.  Pt denies SI/HI/AVH.  He is hoping to discharge home soon.  He makes his needs known to staff.  Discharge plans are in process.  Safety maintained with q15 minute checks.

## 2015-05-28 NOTE — Progress Notes (Signed)
[facility]  05/28/2015 3:14 PM Terry HookerMicah Hutchinson  MRN:  [id number] Subjective:  Patient reports he is feeling "OK" , and at this time minimizes any severe depression . No medication side effects endorsed . Objective : I have discussed case with treatment team . Patient presents fairly groomed, vaguely irritable, but cooperative .  Denies suicidal ideations . Today we had family session with patient's parents, CSW, patient , Clinical research associatewriter . Collateral information from parents is that there is a long history, dating back to middle/high school,  of patterns of behavior such as frequent lying , explosive anger episodes, and difficulty following household rules and regulations . They do say that in general Camelia EngMicah is friendly and a good brother to his siblings, and both parents stressed his intelligence as one of his assets .  Recently there has been increased parental concern due to issues regarding his interactions with other via  Internet / Discord  And possible statements that could result in legal difficulties for patient . They also state that patient spends long periods of time on his computer, so that they need to limit his use . Patient states that computer is very important for him as it is his social outlet to communicate and interact with others and reports boredom, rather than depression, without it ( parents had removed it due to above concerns )  Patient's parents report he does have history of depression, but tends to minimize . A main concern is impulsivity and angry outbursts , usually of short duration . Patient agreed to above, and was more amenable to psychiatric medication trial. Parents agreed that patient can return home , as long as he abides by home rules, and agrees to continue outpatient psychiatric treatment . Parents agreed to return patient's X BOX, and patient relived and happy about this. As noted, patient able to endorse some depression, anxiety , poor impulse control, as presented  by parents, and interested in medication - options were discussed . Principal Problem: Suicide attempt by drug ingestion [facility](HCC) Diagnosis:   Patient Active Problem List   Diagnosis Date Noted  . Major depressive disorder, recurrent episode (HCC) [F33.9] 05/26/2015  . Suicide attempt by drug ingestion (HCC) [T50.902A]   . ADD (attention deficit disorder) [F90.9] 02/20/2011  . Asperger's disorder [F84.5] 02/20/2011  . ODD (oppositional defiant disorder) [F91.3] 02/20/2011   Total Time spent with patient:  35 minutes - more than 50 % of time was spent working on disposition planning and therapy     Past Medical History:  Past Medical History  Diagnosis Date  . ADHD (attention deficit hyperactivity disorder)   . Oppositional defiant disorder   . Asperger's disorder   . Depression   . History of chicken pox   . Anemia   . Migraine     Past Surgical History  Procedure Laterality Date  . Tubes in ears      in the past  . Tonsillectomy    . Wisdom tooth extraction     Family History:  Family History  Problem Relation Age of Onset  . Depression Mother   . ADD / ADHD Father   . Alcohol abuse Maternal Grandfather   . Depression Maternal Grandmother     Social History:  History  Alcohol Use No     History  Drug Use No    Social History   Social History  . Marital Status: Single    Spouse Name: N/A  . Number of Children: N/A  .  Years of Education: N/A   Social History Main Topics  . Smoking status: Never Smoker   . Smokeless tobacco: None  . Alcohol Use: No  . Drug Use: No  . Sexual Activity: No   Other Topics Concern  . None   Social History Narrative   Additional Social History:    Pain Medications: Pt denies Prescriptions: Lexapro, Adderall Over the Counter: Pt denies History of alcohol / drug use?: No history of alcohol / drug abuse Longest period of sobriety (when/how long): NA  Sleep: Good  Appetite:  Fair  Current Medications: Current  Facility-Administered Medications  Medication Dose Route Frequency Provider Last Rate Last Dose  . acetaminophen (TYLENOL) tablet 650 mg  650 mg Oral Q6H PRN Sanjuana Kava, NP      . alum & mag hydroxide-simeth (MAALOX/MYLANTA) 200-200-20 MG/5ML suspension 30 mL  30 mL Oral Q4H PRN Sanjuana Kava, NP      . amphetamine-dextroamphetamine (ADDERALL XR) 24 hr capsule 40 mg  40 mg Oral Daily Craige Cotta, MD   40 mg at 05/27/15 0749  . feeding supplement (ENSURE ENLIVE) (ENSURE ENLIVE) liquid 237 mL  237 mL Oral BID BM Rockey Situ Cobos, MD   237 mL at 05/27/15 1538  . hydrOXYzine (ATARAX/VISTARIL) tablet 25 mg  25 mg Oral Q4H PRN Sanjuana Kava, NP      . magnesium hydroxide (MILK OF MAGNESIA) suspension 30 mL  30 mL Oral Daily PRN Sanjuana Kava, NP      . traZODone (DESYREL) tablet 50 mg  50 mg Oral QHS Sanjuana Kava, NP   50 mg at 05/27/15 2249    Lab Results: No results found for this or any previous visit (from the past 48 hour(s)).  Blood Alcohol level:  Lab Results  Component Value Date   ETH <5 05/26/2015    Physical Findings: AIMS: Facial and Oral Movements Muscles of Facial Expression: None, normal Lips and Perioral Area: None, normal Jaw: None, normal Tongue: None, normal,Extremity Movements Upper (arms, wrists, hands, fingers): None, normal Lower (legs, knees, ankles, toes): None, normal, Trunk Movements Neck, shoulders, hips: None, normal, Overall Severity Severity of abnormal movements (highest score from questions above): None, normal Incapacitation due to abnormal movements: None, normal Patient's awareness of abnormal movements (rate only patient's report): No Awareness, Dental Status Current problems with teeth and/or dentures?: No Does patient usually wear dentures?: No  CIWA:    COWS:     Musculoskeletal: Strength & Muscle Tone: within normal limits Gait & Station: normal Patient leans: N/A  Psychiatric Specialty Exam: ROS denies headache, denies shortness  of breath, no chest pain   Blood pressure 85/52, pulse 108, temperature 98.2 F (36.8 C), temperature source Oral, resp. rate 16, height  (1.676 m), weight 116 lb (52.617 kg).Body mass index is 18.73 kg/(m^2).  General Appearance: Fairly Groomed  Patent attorney::  Fair  Speech:  Normal Rate  Volume:  Normal  Mood:  endorses history of depression, anxiety, but states that at this time he is feeling " all right "  Affect:  appropriate, mildly irritable   Thought Process:  Linear  Orientation:  Other:  fully alert and attentive   Thought Content: denies hallucinations, no delusions, not internally preoccupied   Suicidal Thoughts:  No at this time denies any suicidal ideations, denies any self injurious ideations, denies any violent or homicidal ideations, contracts for safety on the unit   Homicidal Thoughts:  No  Memory:  recent and  remote grossly intact   Judgement:  Fair  Insight:  Fair  Psychomotor Activity:  Normal  Concentration:  Good  Recall:  Good  Fund of Knowledge:Good  Language: Good  Akathisia:  Negative  Handed:  Right  AIMS (if indicated):     Assets:  Desire for Improvement Resilience  ADL's:  Intact  Cognition: WNL  Sleep:  Number of Hours: 6  Assessment - patient tends to minimize recent events, symptoms but today does endorse depression, difficulty with impulse control, angry outbursts , which his parents identify as major issue. Patient's parents identify long history of behavioral patterns such as lying, difficulty conforming to rules, etc, and there is a history of ODD as per chart . Patient agreeing to medication to address impulsivity, angry outbursts, mood. We discussed options to include Depakote ER, Tegretol, Zyprexa. Patient interested in Zyprexa, and interested in its potential for weight gain and sedation, as states he has had difficulty gaining weight and often does not sleep well. Side effects reviewed . Treatment Plan Summary: Daily contact with patient  to assess and evaluate symptoms and progress in treatment, Medication management, Plan inpatient treatment  and medications as below  Encourage ongoing group, milieu participation to work on coping skills and symptom reduction  Continue Adderall XR 40 mgrs QAM for long history of ADHD Start Zyprexa 5 mgrs QHS for insomnia, mood , explosiveness- side effects discussed. Continue Vistaril 25 mgrs Q 6 hours PRN for anxiety as needed  Continue Trazodone 50 mgrs QHS PRN for insomnia as needed Obtain routine HgbA1C, Lipid Panel, Prolactin serum level Patient requesting to have his home pillow, brought by parents,  as he sleeps much better with it- will review with Nursing Staff to determine if safe and appropriate.  Nehemiah Massed, MD 05/28/2015, 3:14 PM

## 2015-05-29 DIAGNOSIS — T50902A Poisoning by unspecified drugs, medicaments and biological substances, intentional self-harm, initial encounter: Secondary | ICD-10-CM

## 2015-05-29 NOTE — [facility] Group Notes (Signed)
[facility] LCSW Group Therapy  05/29/2015 10:15 to 11 AM  Type of Therapy:  Group Therapy  Participation Level:  Did Not Attend; declined encouragement   Carney Bernatherine C Harrill, LCSW

## 2015-05-29 NOTE — Progress Notes (Signed)
Adult Psychoeducational Group Note  Date:  05/29/2015 Time:  9:03 PM  Group Topic/Focus:  Wrap-Up Group:   The focus of this group is to help patients review their daily goal of treatment and discuss progress on daily workbooks.  Participation Level:  Active  Participation Quality:  Appropriate and Attentive  Affect:  Appropriate  Cognitive:  Appropriate  Insight: Appropriate  Engagement in Group:  Engaged  Modes of Intervention:  Discussion  Additional Comments:  Pt stated his goal was to sleep. Pt stated he has a discharge plan in place and is ready to get out.  Caswell CorwinOwen, Isys Tietje C 05/29/2015, 9:03 PM

## 2015-05-29 NOTE — Progress Notes (Signed)
D Per staff at 0630, pt refused to have AM labs drawn. He would not talk about it and / or expand why he refused. A He has refused to answer questions on daily assessment and got quite loud and agitated with this Clinical research associatewriter when he was asked if he would just answer the questions.  Clinical research associatewriter  Would read the actual questions to him and transcribe his answers for him. Per MD order, pt has home pillow on his bed ( pt states this helps him sleep better). R Safety in place. Pt not attending group and has slept in his bed the majority of the day.

## 2015-05-29 NOTE — Progress Notes (Signed)
[facility]  05/29/2015 5:25 PM Terry Hutchinson Rising  MRN:  [id number] Subjective:  Patient reports ' I am fine"   Objective :Terry Hutchinson Holub is awake, alert and oriented X3, found resting in bedroom. Patient appears flat and guarded. With short answer responses. Denies suicidal or homicidal ideation. Denies auditory or visual hallucination and does not appear to be responding to internal stimuli. Patient reports interacting well with staff and others. Patient reports he is medication compliant without mediation side effects. Patient denies depression at this time. Patient reports a good appetite and states he is resting well.Support, encouragement and reassurance was provided.   Principal Problem: Suicide attempt by drug ingestion [facility](HCC) Diagnosis:   Patient Active Problem List   Diagnosis Date Noted  . Major depressive disorder, recurrent episode (HCC) [F33.9] 05/26/2015  . Suicide attempt by drug ingestion (HCC) [T50.902A]   . ADD (attention deficit disorder) [F90.9] 02/20/2011  . Asperger's disorder [F84.5] 02/20/2011  . ODD (oppositional defiant disorder) [F91.3] 02/20/2011   Total Time spent with patient:  35 minutes - more than 50 % of time was spent working on disposition planning and therapy     Past Medical History:  Past Medical History  Diagnosis Date  . ADHD (attention deficit hyperactivity disorder)   . Oppositional defiant disorder   . Asperger's disorder   . Depression   . History of chicken pox   . Anemia   . Migraine     Past Surgical History  Procedure Laterality Date  . Tubes in ears      in the past  . Tonsillectomy    . Wisdom tooth extraction     Family History:  Family History  Problem Relation Age of Onset  . Depression Mother   . ADD / ADHD Father   . Alcohol abuse Maternal Grandfather   . Depression Maternal Grandmother     Social History:  History  Alcohol Use No     History  Drug Use No    Social History   Social History  . Marital  Status: Single    Spouse Name: N/A  . Number of Children: N/A  . Years of Education: N/A   Social History Main Topics  . Smoking status: Never Smoker   . Smokeless tobacco: None  . Alcohol Use: No  . Drug Use: No  . Sexual Activity: No   Other Topics Concern  . None   Social History Narrative   Additional Social History:    Pain Medications: Pt denies Prescriptions: Lexapro, Adderall Over the Counter: Pt denies History of alcohol / drug use?: No history of alcohol / drug abuse Longest period of sobriety (when/how long): NA  Sleep: Good  Appetite:  Fair  Current Medications: Current Facility-Administered Medications  Medication Dose Route Frequency Provider Last Rate Last Dose  . acetaminophen (TYLENOL) tablet 650 mg  650 mg Oral Q6H PRN Sanjuana KavaAgnes I Nwoko, NP      . alum & mag hydroxide-simeth (MAALOX/MYLANTA) 200-200-20 MG/5ML suspension 30 mL  30 mL Oral Q4H PRN Sanjuana KavaAgnes I Nwoko, NP      . amphetamine-dextroamphetamine (ADDERALL XR) 24 hr capsule 40 mg  40 mg Oral Daily Craige CottaFernando A Cobos, MD   40 mg at 05/27/15 0749  . feeding supplement (ENSURE ENLIVE) (ENSURE ENLIVE) liquid 237 mL  237 mL Oral BID BM Rockey SituFernando A Cobos, MD   237 mL at 05/29/15 1000  . hydrOXYzine (ATARAX/VISTARIL) tablet 25 mg  25 mg Oral Q4H PRN Sanjuana KavaAgnes I Nwoko, NP      .  magnesium hydroxide (MILK OF MAGNESIA) suspension 30 mL  30 mL Oral Daily PRN Sanjuana Kava, NP      . OLANZapine (ZYPREXA) tablet 5 mg  5 mg Oral QHS Craige Cotta, MD   5 mg at 05/28/15 2236  . traZODone (DESYREL) tablet 50 mg  50 mg Oral QHS Sanjuana Kava, NP   50 mg at 05/28/15 2236    Lab Results: No results found for this or any previous visit (from the past 48 hour(s)).  Blood Alcohol level:  Lab Results  Component Value Date   ETH <5 05/26/2015    Physical Findings: AIMS: Facial and Oral Movements Muscles of Facial Expression: None, normal Lips and Perioral Area: None, normal Jaw: None, normal Tongue: None, normal,Extremity  Movements Upper (arms, wrists, hands, fingers): None, normal Lower (legs, knees, ankles, toes): None, normal, Trunk Movements Neck, shoulders, hips: None, normal, Overall Severity Severity of abnormal movements (highest score from questions above): None, normal Incapacitation due to abnormal movements: None, normal Patient's awareness of abnormal movements (rate only patient's report): No Awareness, Dental Status Current problems with teeth and/or dentures?: No Does patient usually wear dentures?: No  CIWA:    COWS:     Musculoskeletal: Strength & Muscle Tone: within normal limits Gait & Station: normal Patient leans: N/A  Psychiatric Specialty Exam: Review of Systems  Psychiatric/Behavioral: Positive for depression. The patient is nervous/anxious and has insomnia.   All other systems reviewed and are negative.  denies headache, denies shortness of breath, no chest pain   Blood pressure 96/73, pulse 92, temperature 97.6 F (36.4 C), temperature source Oral, resp. rate 18, height  (1.676 m), weight 52.617 kg (116 lb).Body mass index is 18.73 kg/(m^2).  General Appearance: Fairly Groomed and Guarded  Patent attorney::  Fair  Speech:  Normal Rate  Volume:  Normal  Mood:  endorses history of depression, anxiety, but states that at this time he is feeling " all right "  Affect:  appropriate, mildly irritable   Thought Process:  Linear  Orientation:  Other:  fully alert and attentive   Thought Content: denies hallucinations,   Suicidal Thoughts:  No at this time denies any suicidal ideations, denies any self injurious ideations, denies any violent or homicidal ideations, contracts for safety on the unit   Homicidal Thoughts:  No  Memory:  recent and remote grossly intact   Judgement:  Fair  Insight:  Fair  Psychomotor Activity:  Normal  Concentration:  Good  Recall:  Good  Fund of Knowledge:Good  Language: Good  Akathisia:  Negative  Handed:  Right  AIMS (if indicated):      Assets:  Desire for Improvement Resilience  ADL's:  Intact  Cognition: WNL  Sleep:  Number of Hours: 6.75    I agree with current treatment plan on 05/29/2015, Patient seen face-to-face for psychiatric evaluation follow-up, chart reviewed. Reviewed the information documented and agree with the treatment plan.  Treatment Plan Summary: Daily contact with patient to assess and evaluate symptoms and progress in treatment, Medication management, Plan inpatient treatment  and medications as below  Encourage ongoing group, milieu participation to work on coping skills and symptom reduction  Continue Adderall XR 40 mgrs QAM for long history of ADHD Start Zyprexa 5 mgrs QHS for insomnia, mood , explosiveness- side effects discussed. Continue Vistaril 25 mgrs Q 6 hours PRN for anxiety as needed  Continue Trazodone 50 mgrs QHS PRN for insomnia as needed Obtain routine HgbA1C, Lipid Panel, Prolactin  serum level to be collected on 05/30/2015 Patient requesting to have his home pillow, brought by parents,  as he sleeps much better with it- will review with Nursing Staff to determine if safe and appropriate.   Oneta Rack, NP 05/29/2015, 5:25 PM I agree with assessment and plan Reymundo Poll. Dub Mikes, M.D.

## 2015-05-29 NOTE — Progress Notes (Signed)
Patient has been up and active on the unit, attended group this evening and has voiced no complaints. Patient currently denies having pain, -si/hi/a/v hall. Support and encouragement offered, safety maintained on unit, will continue to monitor.  

## 2015-05-29 NOTE — Progress Notes (Signed)
Terry Hutchinson is seen lying in his bed- prone - asleep.He refused to get OOB and / or wake up this morning and take his medication, as well as attend any groups. He also refused to complete his daily assessment as well as answer the questions on his daily assessment ( when this nurse asked him). A He is currently asleep in his bed. R Safety is in place.

## 2015-05-30 NOTE — Progress Notes (Signed)
Patient has been up and active on the unit, attended group this evening and has voiced no complaints. Patient currently denies having pain, -si/hi/a/v hall. Support and encouragement offered, safety maintained on unit, will continue to monitor.  

## 2015-05-30 NOTE — Plan of Care (Signed)
Problem: Diagnosis: Increased Risk For Suicide Attempt Goal: STG-Patient Will Comply With Medication Regime Outcome: Progressing Patient is compliant with scheduled medications.     

## 2015-05-30 NOTE — [facility] Group Notes (Signed)
[facility] Group Notes:  (Nursing/MHT/Case Management/Adjunct)  Date:  05/30/2015  Time:  1315  Type of Therapy:  Nurse Education   /   Healthy Support Systems :   The group focuses on teaching patients how to develop and utilize healthy support systems in their lives.  Participation Level:  Active  Participation Quality:  Attentive  Affect:  Anxious  Cognitive:  Lacking  Insight:  Limited  Engagement in Group:  Limited  Modes of Intervention:  Education  Summary of Progress/Problems:  Rich BraveDuke, Audry Pecina Lynn 05/30/2015, 4:02 PM

## 2015-05-30 NOTE — Progress Notes (Addendum)
D Talor got up this morning, went to cafeteria and then returned back to sleep in his bed, immediately AFTER returning to the unit this morning,. Upon returning to his bed this morning, he was re-awakened by this Clinical research associatewriter, at [phone] , for his scheduled adderrall IR 400 mg. He is given this after his BP is checked again  RN spoke with pt inquiring why he refused blood draw this AM and yesterday AM and he will not engage in any possibility that includes  Venipuncture. Note left on front of electronic chart notifying MD's of pt behavior. A HE received his adderrall this AM, as scheduled and then went back to sleep until 1130, at which point he got up and has stayed OOB since getting up. He is encouraged to shower ( he has BO) and allow staff to wash his clothes ( they are soiled) . He drinks Ensures ; chocolate. He minimizes his issues. He has demonstrated no adverse side effects of adderrall / zyprexa medication regimen and says " I'm going home tomorrow". R he adamantly refuses to complete his daily assessment and this nurse charts as such on the self inventory. Safety in place.

## 2015-05-30 NOTE — Progress Notes (Signed)
[facility]  05/30/2015 10:22 AM Terry HookerMicah Hutchinson  MRN:  [id number] Subjective:  Patient reports ' I am feeling fine ready to go."  - Patient inquired about bringing a board game on the unit.- denied request  Objective :Terry HookerMicah Hutchinson is awake, alert and oriented X3, found attending group session. Patient appears flat, depressed and guarded. With short answer responses. Denies suicidal or homicidal ideation. Denies auditory or visual hallucination and does not appear to be responding to internal stimuli. Patient reports interacting well with staff and others. Patient reports he is medication compliant without mediation side effects. Patient denies depression at this time. Patient reports a good appetite and states he is resting well.Support, encouragement and reassurance was provided.   Principal Problem: Suicide attempt by drug ingestion [facility](HCC) Diagnosis:   Patient Active Problem List   Diagnosis Date Noted  . Major depressive disorder, recurrent episode (HCC) [F33.9] 05/26/2015  . Suicide attempt by drug ingestion (HCC) [T50.902A]   . ADD (attention deficit disorder) [F90.9] 02/20/2011  . Asperger's disorder [F84.5] 02/20/2011  . ODD (oppositional defiant disorder) [F91.3] 02/20/2011   Total Time spent with patient:  35 minutes - more than 50 % of time was spent working on disposition planning and therapy     Past Medical History:  Past Medical History  Diagnosis Date  . ADHD (attention deficit hyperactivity disorder)   . Oppositional defiant disorder   . Asperger's disorder   . Depression   . History of chicken pox   . Anemia   . Migraine     Past Surgical History  Procedure Laterality Date  . Tubes in ears      in the past  . Tonsillectomy    . Wisdom tooth extraction     Family History:  Family History  Problem Relation Age of Onset  . Depression Mother   . ADD / ADHD Father   . Alcohol abuse Maternal Grandfather   . Depression Maternal Grandmother     Social  History:  History  Alcohol Use No     History  Drug Use No    Social History   Social History  . Marital Status: Single    Spouse Name: N/A  . Number of Children: N/A  . Years of Education: N/A   Social History Main Topics  . Smoking status: Never Smoker   . Smokeless tobacco: None  . Alcohol Use: No  . Drug Use: No  . Sexual Activity: No   Other Topics Concern  . None   Social History Narrative   Additional Social History:    Pain Medications: Pt denies Prescriptions: Lexapro, Adderall Over the Counter: Pt denies History of alcohol / drug use?: No history of alcohol / drug abuse Longest period of sobriety (when/how long): NA  Sleep: Good  Appetite:  Fair  Current Medications: Current Facility-Administered Medications  Medication Dose Route Frequency Provider Last Rate Last Dose  . acetaminophen (TYLENOL) tablet 650 mg  650 mg Oral Q6H PRN Sanjuana KavaAgnes I Nwoko, NP      . alum & mag hydroxide-simeth (MAALOX/MYLANTA) 200-200-20 MG/5ML suspension 30 mL  30 mL Oral Q4H PRN Sanjuana KavaAgnes I Nwoko, NP      . amphetamine-dextroamphetamine (ADDERALL XR) 24 hr capsule 40 mg  40 mg Oral Daily Craige CottaFernando A Cobos, MD   40 mg at 05/30/15 0737  . feeding supplement (ENSURE ENLIVE) (ENSURE ENLIVE) liquid 237 mL  237 mL Oral BID BM Craige CottaFernando A Cobos, MD   237 mL at 05/30/15  1610  . hydrOXYzine (ATARAX/VISTARIL) tablet 25 mg  25 mg Oral Q4H PRN Sanjuana Kava, NP      . magnesium hydroxide (MILK OF MAGNESIA) suspension 30 mL  30 mL Oral Daily PRN Sanjuana Kava, NP      . OLANZapine (ZYPREXA) tablet 5 mg  5 mg Oral QHS Craige Cotta, MD   5 mg at 05/29/15 2100  . traZODone (DESYREL) tablet 50 mg  50 mg Oral QHS Sanjuana Kava, NP   50 mg at 05/29/15 2100    Lab Results: No results found for this or any previous visit (from the past 48 hour(s)).  Blood Alcohol level:  Lab Results  Component Value Date   ETH <5 05/26/2015    Physical Findings: AIMS: Facial and Oral Movements Muscles of Facial  Expression: None, normal Lips and Perioral Area: None, normal Jaw: None, normal Tongue: None, normal,Extremity Movements Upper (arms, wrists, hands, fingers): None, normal Lower (legs, knees, ankles, toes): None, normal, Trunk Movements Neck, shoulders, hips: None, normal, Overall Severity Severity of abnormal movements (highest score from questions above): None, normal Incapacitation due to abnormal movements: None, normal Patient's awareness of abnormal movements (rate only patient's report): No Awareness, Dental Status Current problems with teeth and/or dentures?: No Does patient usually wear dentures?: No  CIWA:    COWS:     Musculoskeletal: Strength & Muscle Tone: within normal limits Gait & Station: normal Patient leans: N/A  Psychiatric Specialty Exam: Review of Systems  Psychiatric/Behavioral: Positive for depression. The patient is nervous/anxious and has insomnia.   All other systems reviewed and are negative.  denies headache, denies shortness of breath, no chest pain   Blood pressure 96/73, pulse 92, temperature 97.6 F (36.4 C), temperature source Oral, resp. rate 18, height  (1.676 m), weight 52.617 kg (116 lb).Body mass index is 18.73 kg/(m^2).  General Appearance: Casual and Guarded  Eye Contact::  Fair  Speech:  Normal Rate  Volume:  Normal  Mood:  endorses history of depression, anxiety, but states that at this time he is feeling " all right "  Affect:  appropriate, mildly irritable   Thought Process:  Linear  Orientation:  Other:  fully alert and attentive   Thought Content: denies hallucinations,   Suicidal Thoughts:  No at this time denies any suicidal ideations, denies any self injurious ideations, denies any violent or homicidal ideations, contracts for safety on the unit   Homicidal Thoughts:  No  Memory:  recent and remote grossly intact   Judgement:  Fair  Insight:  Fair  Psychomotor Activity:  Normal  Concentration:  Good  Recall:  Good   Fund of Knowledge:Good  Language: Good  Akathisia:  Negative  Handed:  Right  AIMS (if indicated):     Assets:  Desire for Improvement Resilience  ADL's:  Intact  Cognition: WNL  Sleep:  Number of Hours: 6.75    I agree with current treatment plan on 05/30/2015, Patient seen face-to-face for psychiatric evaluation follow-up, chart reviewed. Reviewed the information documented and agree with the treatment plan.  Treatment Plan Summary: Daily contact with patient to assess and evaluate symptoms and progress in treatment, Medication management, Plan inpatient treatment  and medications as below    Encourage ongoing group, milieu participation to work on coping skills and symptom reduction  Continue Adderall XR 40 mgrs QAM for long history of ADHD Start Zyprexa 5 mgrs QHS for insomnia, mood , explosiveness- side effects discussed. Continue Vistaril 25 mgrs  Q 6 hours PRN for anxiety as needed  Continue Trazodone 50 mgrs QHS PRN for insomnia as needed Obtain routine: HgbA1C, Lipid Panel, Prolactin serum level ordered on 05/30/2015 Patient requesting to have his home pillow, brought by parents,  as he sleeps much better with it- will review with Nursing Staff to determine if safe and appropriate.   Oneta Rack, NP 05/30/2015, 10:22 AM I agree with assessment and plan Reymundo Poll. Dub Mikes, M.D.

## 2015-05-30 NOTE — Progress Notes (Signed)
Adult Psychoeducational Group Note  Date:  05/30/2015 Time:  9:05 PM  Group Topic/Focus:  Wrap-Up Group:   The focus of this group is to help patients review their daily goal of treatment and discuss progress on daily workbooks.  Participation Level:  Active  Participation Quality:  Appropriate and Attentive  Affect:  Appropriate  Cognitive:  Appropriate  Insight: Appropriate  Engagement in Group:  Engaged  Modes of Intervention:  Discussion  Additional Comments:  Pt stated he had a good day today and he did not have a goal for today.  Caswell CorwinOwen, Humza Tallerico C 05/30/2015, 9:05 PM

## 2015-05-30 NOTE — [facility] Group Notes (Signed)
[facility] LCSW Group Therapy Note   05/30/2015  10:15 - 11 AM  Type of Therapy and Topic: Group Therapy: Feelings Around Returning Home & Establishing a Supportive Framework and Activity to Identify signs of Improvement or Decompensation   Participation Level: Minimal   Description of Group:  Patients first processed thoughts and feelings about up coming discharge. These included fears of upcoming changes, lack of change, new living environments, judgements and expectations from others and overall stigma of MH issues. We then discussed what is a supportive framework? What does it look like feel like and how do I discern it from and unhealthy non-supportive network? Learn how to cope when supports are not helpful and don't support you. Discuss what to do when your family/friends are not supportive.   Therapeutic Goals Addressed in Processing Group:  1. Patient will identify one healthy supportive network that they can use at discharge. 2. Patient will identify one factor of a supportive framework and how to tell it from an unhealthy network. 3. Patient able to identify one coping skill to use when they do not have positive supports from others. 4. Patient will demonstrate ability to communicate their needs through discussion and/or role plays.  Summary of Patient Progress:  Pt engaged easily during group session. As patients processed their anxiety about discharge and described healthy supports patient was inattentive and in and out of group room. Patient did participate minimally in group activity; he chose a visual to represent decompensation as 'feeling lost and in the dark'  and improvement as 'running free.'  Carney Bernatherine C Aniayah Alaniz, LCSW

## 2015-05-31 LAB — LIPID PANEL
CHOL/HDL RATIO: 3.7 ratio
CHOLESTEROL: 176 mg/dL (ref 0–200)
HDL: 47 mg/dL (ref >40)
LDL Cholesterol: 74 mg/dL (ref 0–99)
Triglycerides: 273 mg/dL — ABNORMAL HIGH (ref <150)
VLDL: 55 mg/dL — ABNORMAL HIGH (ref 0–40)

## 2015-05-31 MED ORDER — OLANZAPINE 5 MG PO TABS: 5.0000 mg | ORAL_TABLET | Freq: Every day | ORAL | Status: DC

## 2015-05-31 MED ORDER — TRAZODONE HCL 50 MG PO TABS: 50.0000 mg | ORAL_TABLET | Freq: Every day | ORAL | Status: DC

## 2015-05-31 MED ORDER — HYDROXYZINE HCL 25 MG PO TABS: 25.0000 mg | ORAL_TABLET | ORAL | Status: DC | PRN

## 2015-05-31 MED ORDER — AMPHETAMINE-DEXTROAMPHET ER 25 MG PO CP24: 50.0000 mg | ORAL_CAPSULE | ORAL | Status: DC

## 2015-05-31 NOTE — [facility] Suicide Risk Assessment (Addendum)
[facility] Discharge Suicide Risk Assessment   Principal Problem: Suicide attempt by drug ingestion [facility]) Discharge Diagnoses:  Patient Active Problem List   Diagnosis Date Noted  . Major depressive disorder, recurrent episode (HCC) [F33.9] 05/26/2015  . Suicide attempt by drug ingestion (HCC) [T50.902A]   . ADD (attention deficit disorder) [F90.9] 02/20/2011  . Asperger's disorder [F84.5] 02/20/2011  . ODD (oppositional defiant disorder) [F91.3] 02/20/2011    Total Time spent with patient: 30 minutes   Musculoskeletal: Strength & Muscle Tone: within normal limits Gait & Station: normal Patient leans: N/A  Psychiatric Specialty Exam: ROS  Blood pressure 100/59, pulse 59, temperature 98 F (36.7 C), temperature source Oral, resp. rate 16, height  (1.676 m), weight 116 lb (52.617 kg).Body mass index is 18.73 kg/(m^2).  General Appearance: Fairly Groomed  Patent attorney::  Good  Speech:  Normal Rate409  Volume:  Normal  Mood:  denies depression, states mood is normal   Affect:  Appropriate  Thought Process:  Linear  Orientation:  Other:  fully alert and attentive   Thought Content:  denies hallucinations, no delusions , less ruminative about stressors   Suicidal Thoughts:  No denies any suicidal or self injurious ideations, denies any self injurious ideations   Homicidal Thoughts:  No denies any violent or homicidal ideations towards anyone   Memory:  recent and remote grossly intact   Judgement:  Other:  improved   Insight:  improving   Psychomotor Activity:  Normal  Concentration:  Good  Recall:  Good  Fund of Knowledge:Good  Language: Good  Akathisia:  Negative  Handed:  Right  AIMS (if indicated):     Assets:  Communication Skills Desire for Improvement Resilience  Sleep:  Number of Hours: 530  Cognition: WNL  ADL's:  Intact   Mental Status Per Nursing Assessment::   On Admission:  Self-harm behaviors  Demographic Factors:  20 year old single  Male, currently  unemployed, lives with his parents   Loss Factors: Withdrew from college last semester, parents took away computer, and tension with persons he interacts via video games   Historical Factors: Long history of ADHD, for which he has been on Adderall .  Has also been diagnosed with Asperger's Disorder. Family, patient have reported history of impulsivity, angry outbursts .  Risk Reduction Factors:   Living with another person, especially a relative and Positive social support  Continued Clinical Symptoms:  At this time he is alert, attentive, calm , cooperative, mood is described as "OK", denies depression, no thought disorder, no SI, no HI, denies any self injurious or violent ideations, no psychotic symptoms, future oriented, states he is planning to look for a job .  At this time denies medication side effects. No akathisia or excessive drowsiness from Zyprexa trial, and states he does feel it is helping him feel better.  We reviewed side effect profile .  Cognitive Features That Contribute To Risk:  No gross cognitive deficits noted upon discharge. Is alert , attentive, and oriented x 3   Suicide Risk:  Mild:  Suicidal ideation of limited frequency, intensity, duration, and specificity.  There are no identifiable plans, no associated intent, mild dysphoria and related symptoms, good self-control (both objective and subjective assessment), few other risk factors, and identifiable protective factors, including available and accessible social support.  Follow-up Information    Follow up with [facility] On 06/21/2015.   Specialty:  Behavioral Health   Why:  at 8:00am with Dr. Rutherford Limerick  for medication management. If you are interested in therapy, please request an appointment when you arrive   Contact information:   [address] [place] [id number] [phone]      Plan Of Care/Follow-up recommendations:  Activity:  As  tolerated Diet:  Regular Tests:  NA Other:  see below  Patient is leaving unit in good spirits . He plans to return home- lives with parents . Follow up as above . Also referred to IOP for ongoing outpatient treatment  Nehemiah MassedOBOS, FERNANDO, MD 05/31/2015, 9:54 AM

## 2015-05-31 NOTE — Progress Notes (Signed)
D:  Patient's self inventory sheet, patient sleeps good, sleep medication is helpful.  Good appetite, normal energy level, good concentration.  Denied depression, hopeless and anxiety.  Denied SI.  Denied physical problems.  Denied pain.  Goal is to discharge today.  Does have discharge plans. A:  Medications administered per MD orders.  Emotional support and encouragement given patient. R:  Denied SI and HI, contract for safety.  Denied A/V hallucinations.  Denied pain.  Safety maintained with 15 minute checks.

## 2015-05-31 NOTE — Progress Notes (Signed)
Discharge Note:  Patient discharged home with family member.  Patient denied SI and HI.  Denied A/V hallucinations. Patient stated he received all his belongings, clothing, toiletries, misc items, prescriptions, etc.  Suicide prevention information given and discussed with patient who stated he understood and had no questions.  Patient stated he appreciated all [facility].  All required discharge information given to patient at discharge.

## 2015-05-31 NOTE — Progress Notes (Signed)
  [facility] :  Will you be returning to the same living situation after discharge:  Yes,  Pt returning home At discharge, do you have transportation home?: Yes,  Pt parents to pick up Do you have the ability to pay for your medications: Yes,  Pt provided with prescriptions  Release of information consent forms completed and in the chart;  Patient's signature needed at discharge.  Patient to Follow up at: Follow-up Information    Follow up with [facility] On 06/21/2015.   Specialty:  Behavioral Health   Why:  at 8:00am with Dr. Rutherford Limerickadepalli for medication management. [place] Sinkita from the [facility] program will contact you regarding a start date for the intensive outpatient program.   Contact information:   [address] [place] [id number] [phone]      Next level of care provider has [facility]:yes  Safety Planning and Suicide Prevention discussed: Yes,  with mother; see SPE note for further details  Have you used any form of tobacco in the last 30 days? (Cigarettes, Smokeless Tobacco, Cigars, and/or Pipes): No  Has patient been referred to the Quitline?: N/A patient is not a smoker  Patient has been referred for addiction treatment: N/A  Elaina HoopsCarter, Audrina Marten M 05/31/2015, 9:56 AM

## 2015-05-31 NOTE — Tx Team (Signed)
Interdisciplinary Treatment Plan Update (Adult) Date: 05/31/2015   Date: 05/31/2015 9:53 AM  Progress in Treatment:  Attending groups: Intermittently  Participating in groups: Intermittently Taking medication as prescribed: Yes  Tolerating medication: Yes  Family/Significant othe contact made: Yes, with mother Patient understands diagnosis: Yes AEB seeking help with depression Discussing patient identified problems/goals with staff: Yes  Medical problems stabilized or resolved: Yes  Denies suicidal/homicidal ideation: Yes Patient has not harmed self or Others: Yes   New problem(s) identified: None identified at this time.   Discharge Plan or Barriers: Pt will return home and follow-up with [facility]  Additional comments:  Patient and CSW reviewed pt's identified goals and treatment plan. Patient verbalized understanding and agreed to treatment plan. CSW reviewed [facility] "Discharge Process and Patient Involvement" Form. Pt verbalized understanding of information provided and signed form.   Reason for Continuation of Hospitalization:  Depression Medication stabilization Suicidal ideation  Estimated length of stay: 0 days  Review of initial/current patient goals per problem list:   1.  Goal(s): Patient will participate in aftercare plan  Met:  Yes  Target date: 3-5 days from date of admission   As evidenced by: Patient will participate within aftercare plan AEB aftercare provider and housing plan at discharge being identified.   05/27/15:  Pt will return home and follow-up with [facility]  2.  Goal (s): Patient will exhibit decreased depressive symptoms and suicidal ideations.  Met:  Yes  Target date: 3-5 days from date of admission   As evidenced by: Patient will utilize self rating of depression at 3 or below and demonstrate decreased signs of depression or be deemed stable for discharge by MD.  05/27/15: Pt reports improvement in depressive symptoms since  admission. 05/31/2015: Pt rates depression at 0/10; denies SI  Attendees:  Patient:    Family:    Physician: Dr. Parke Poisson, MD  05/31/2015 9:53 AM  Nursing:  05/31/2015 9:53 AM  Clinical Social Worker Peri Maris, [place] 05/31/2015 9:53 AM  Other: Tilden Fossa, [place] 05/31/2015 9:53 AM  Clinical:  Grayland Ormond, RN; Eulogio Bear, RN 05/31/2015 9:53 AM  Other: , RN Charge Nurse 05/31/2015 9:53 AM  Other:     Peri Maris, [place] Social Work [phone]

## 2015-05-31 NOTE — Progress Notes (Signed)
Patient has been up and active on the unit, attended group this evening and has voiced no complaints. Patient currently denies having pain, -si/hi/a/v hall. Support and encouragement offered, safety maintained on unit, will continue to monitor.  

## 2015-05-31 NOTE — Plan of Care (Signed)
Problem: Ineffective individual coping Goal: STG: Patient will remain free from self harm Outcome: Progressing Patient has remained free from self harm and safety maintained on unit with 15 min checks.     

## 2015-05-31 NOTE — Discharge Summary (Signed)
Physician Discharge Summary Note  Patient:  Terry Hutchinson is an 20 y.o., male MRN:  [id number] DOB:  1996/02/04 Patient phone:  [phone] (home)  Patient address:   [address] [place] [address],  Total Time spent with patient: Greater than 30 minutes  Date of Admission:  05/26/2015  Date of Discharge: 05-31-15  Reason for Admission: Suicidal ideations  Principal Problem: Suicide attempt by drug ingestion [facility])  Discharge Diagnoses: Patient Active Problem List   Diagnosis Date Noted  . Major depressive disorder, recurrent episode (HCC) [F33.9] 05/26/2015  . Suicide attempt by drug ingestion (HCC) [T50.902A]   . ADD (attention deficit disorder) [F90.9] 02/20/2011  . Asperger's disorder [F84.5] 02/20/2011  . ODD (oppositional defiant disorder) [F91.3] 02/20/2011   Past Psychiatric History:   Past Medical History:  Past Medical History  Diagnosis Date  . ADHD (attention deficit hyperactivity disorder)   . Oppositional defiant disorder   . Asperger's disorder   . Depression   . History of chicken pox   . Anemia   . Migraine     Past Surgical History  Procedure Laterality Date  . Tubes in ears      in the past  . Tonsillectomy    . Wisdom tooth extraction     Family History:  Family History  Problem Relation Age of Onset  . Depression Mother   . ADD / ADHD Father   . Alcohol abuse Maternal Grandfather   . Depression Maternal Grandmother    Family Psychiatric  History: See H&P  Social History:  History  Alcohol Use No     History  Drug Use No    Social History   Social History  . Marital Status: Single    Spouse Name: N/A  . Number of Children: N/A  . Years of Education: N/A   Social History Main Topics  . Smoking status: Never Smoker   . Smokeless tobacco: None  . Alcohol Use: No  . Drug Use: No  . Sexual Activity: No   Other Topics Concern  . None   Social History Narrative   Hospital Course: 20 year old male, reports recent  impulsive overdose on Naprosyn. Reports " I think I took about 20 pills "States that this was not planned out, " spontaneous". States " I have been sad, but I would not say that depressed ". States that a triggering event might have been a discussion with his parents , which he states " I don't even know what it was about, something stupid, not a big thing".  States that argument with parents centered around their wanting him to be more active, " get out of bed, look for a job"   Terry Hutchinson was admitted to the adult unit for suicide attempt by overdose after having an argument with parents. He stated that his action was impulsive & would not consider himself depressed. He presented with hx of ADHD, Asperger's disorder & ODD. During his admission assessment, Terry Hutchinson was evaluated and his symptoms identified. The medication management was discussed and initiated targeting those presenting symptoms. He was oriented to the unit and encouraged to participate in the unit programming. His other pre-existing psychiatric conditions were identified and treated appropriately by resuming his pertinent home medication for those mental health issues.  During his hospital stay, Terry Hutchinson was evaluated each day by a clinical provider to ascertain his response to his treatment regimen. The medication changes & adjustments were made according to need. As the day goes by, improvement  was noted as evidenced by his report of decreasing symptoms, improved mood, medication tolerance & participation in the unit programming.  He was required on daily basis to complete a self inventory asssessment noting mood, mental status, any new symptoms, anxiety & or other concerns. His symptoms responded well to his treatment regimen, being in a therapeutic and supportive environment also assisted in his mood stability. Terry Hutchinson did present appropriate behavior & was motivated for recovery. He worked closely with the treatment team and case manager to  develop a discharge plan with appropriate goals to maintain mood stability after discharge. Coping skills, problem solving as well as relaxation therapies were also part of the unit programming.  On this day of his hospital discharge, Terry Hutchinson was in much improved condition than upon admission. His symptoms were reported as significantly decreased or resolved completely. Upon discharge, he denies SIHI and voiced no AVH. He was motivated to continue taking medication with a goal of continued improvement in mental health. He was medicated & discharge on; Adderall XR 25 mg for ADHD, Hydroxyzine 25 mg prn for anxiety, Olanzapine 5 mg for mood control & Trazodone 50 mg for insomnia. He is discharged to follow-up care as noted below. He is provided with all the necessary information needed to make this appointment without problems. He left [facility] in no apparent distress. Transportation per parents..   Physical Findings: AIMS: Facial and Oral Movements Muscles of Facial Expression: None, normal Lips and Perioral Area: None, normal Jaw: None, normal Tongue: None, normal,Extremity Movements Upper (arms, wrists, hands, fingers): None, normal Lower (legs, knees, ankles, toes): None, normal, Trunk Movements Neck, shoulders, hips: None, normal, Overall Severity Severity of abnormal movements (highest score from questions above): None, normal Incapacitation due to abnormal movements: None, normal Patient's awareness of abnormal movements (rate only patient's report): No Awareness, Dental Status Current problems with teeth and/or dentures?: No Does patient usually wear dentures?: No  CIWA:    COWS:     Musculoskeletal: Strength & Muscle Tone: within normal limits Gait & Station: normal Patient leans: N/A  Psychiatric Specialty Exam: Review of Systems  Constitutional: Negative.   HENT: Negative.   Eyes: Negative.   Respiratory: Negative.   Cardiovascular: Negative.   Gastrointestinal: Negative.    Genitourinary: Negative.   Musculoskeletal: Negative.   Skin: Negative.   Endo/Heme/Allergies: Negative.   Psychiatric/Behavioral: Negative for depression, suicidal ideas, hallucinations, memory loss and substance abuse. The patient has insomnia (Stable). The patient is not nervous/anxious.     Blood pressure 100/59, pulse 59, temperature 98 F (36.7 C), temperature source Oral, resp. rate 16, height  (1.676 m), weight 52.617 kg (116 lb).Body mass index is 18.73 kg/(m^2).  See Md's SRA   Have you used any form of tobacco in the last 30 days? (Cigarettes, Smokeless Tobacco, Cigars, and/or Pipes): No  Has this patient used any form of tobacco in the last 30 days? (Cigarettes, Smokeless Tobacco, Cigars, and/or Pipes): No  Blood Alcohol level:  Lab Results  Component Value Date   ETH <5 05/26/2015    Metabolic Disorder Labs:  No results found for: HGBA1C, MPG No results found for: PROLACTIN Lab Results  Component Value Date   CHOL 176 05/31/2015   TRIG 273* 05/31/2015   HDL 47 05/31/2015   CHOLHDL 3.7 05/31/2015   VLDL 55* 05/31/2015   LDLCALC 74 05/31/2015   See Psychiatric Specialty Exam and Suicide Risk Assessment completed by Attending Physician prior to discharge.  Discharge destination:  Home  Is patient  on multiple antipsychotic therapies at discharge:  No   Has Patient had three or more failed trials of antipsychotic monotherapy by history:  No  Recommended Plan for Multiple Antipsychotic Therapies: NA    Medication List    STOP taking these medications        escitalopram 20 MG tablet  Commonly known as:  LEXAPRO      TAKE these medications      Indication   amphetamine-dextroamphetamine 25 MG 24 hr capsule  Commonly known as:  ADDERALL XR  Take 2 capsules by mouth every morning. For ADHD   Indication:  Attention Deficit Hyperactivity Disorder     hydrOXYzine 25 MG tablet  Commonly known as:  ATARAX/VISTARIL  Take 1 tablet (25 mg total) by mouth  every 4 (four) hours as needed for anxiety (Sleep).   Indication:  Anxiety/sleep     OLANZapine 5 MG tablet  Commonly known as:  ZYPREXA  Take 1 tablet (5 mg total) by mouth at bedtime. For mood control   Indication:  Mood control     traZODone 50 MG tablet  Commonly known as:  DESYREL  Take 1 tablet (50 mg total) by mouth at bedtime. For sleep   Indication:  Trouble Sleeping           Follow-up Information    Follow up with [facility] On 06/21/2015.   Specialty:  Behavioral Health   Why:  at 8:00am with Dr. Rutherford Limerickadepalli for medication management. Rita from the [facility] program will contact you regarding a start date for the intensive outpatient program.   Contact information:   [address] [place] [id number] [phone]     Follow-up recommendations: Activity:  As tolerated Diet: As recommended by your primary care doctor. Keep all scheduled follow-up appointments as recommended.   Comments: Take all your medications as prescribed by your mental healthcare provider. Report any adverse effects and or reactions from your medicines to your outpatient provider promptly. Patient is instructed and cautioned to not engage in alcohol and or illegal drug use while on prescription medicines. In the event of worsening symptoms, patient is instructed to call the crisis hotline, 911 and or go to the nearest ED for appropriate evaluation and treatment of symptoms. Follow-up with your primary care provider for your other medical issues, concerns and or health care needs.   Signed: Sanjuana KavaNwoko, Agnes I, NP, PMHNP, FNP-BC 05/31/2015, 10:05 AM   Patient seen, Suicide Assessment Completed.  Disposition Plan Reviewed

## 2015-05-31 NOTE — Progress Notes (Signed)
Recreation Therapy Notes  Date: 04.10.2017 Time: 9:30am Location: [address] Group Room   Group Topic: Stress Management  Goal Area(s) Addresses:  Patient will actively participate in stress management techniques presented during session.   Behavioral Response: Did not attend.   Rana Hochstein L Zamyia Gowell, LRT/CTRS        Ark Agrusa L 05/31/2015 1:46 PM 

## 2015-06-01 LAB — HEMOGLOBIN A1C
HEMOGLOBIN A1C: 5.2 % (ref 4.8–5.6)
MEAN PLASMA GLUCOSE: 103 mg/dL

## 2015-06-01 LAB — PROLACTIN: Prolactin: 19.6 ng/mL — ABNORMAL HIGH (ref 4.0–15.2)

## 2015-06-21 ENCOUNTER — Encounter (HOSPITAL_COMMUNITY): Payer: Self-pay | Admitting: Psychiatry

## 2015-06-21 ENCOUNTER — Ambulatory Visit (INDEPENDENT_AMBULATORY_CARE_PROVIDER_SITE_OTHER): Payer: BLUE CROSS/BLUE SHIELD | Admitting: Psychiatry

## 2015-06-21 VITALS — BP 110/64 | HR 87 | Ht 65.0 in | Wt 133.0 lb

## 2015-06-21 DIAGNOSIS — F913 Oppositional defiant disorder: Secondary | ICD-10-CM

## 2015-06-21 DIAGNOSIS — F909 Attention-deficit hyperactivity disorder, unspecified type: Secondary | ICD-10-CM

## 2015-06-21 DIAGNOSIS — F988 Other specified behavioral and emotional disorders with onset usually occurring in childhood and adolescence: Secondary | ICD-10-CM

## 2015-06-21 DIAGNOSIS — F845 Asperger's syndrome: Secondary | ICD-10-CM

## 2015-06-21 DIAGNOSIS — F330 Major depressive disorder, recurrent, mild: Secondary | ICD-10-CM | POA: Diagnosis not present

## 2015-06-21 MED ORDER — OLANZAPINE 5 MG PO TABS: 5.0000 mg | ORAL_TABLET | Freq: Every day | ORAL | Status: DC

## 2015-06-21 MED ORDER — TRAZODONE HCL 50 MG PO TABS: 50.0000 mg | ORAL_TABLET | Freq: Every day | ORAL | Status: DC

## 2015-06-21 MED ORDER — AMPHETAMINE-DEXTROAMPHET ER 25 MG PO CP24: 50.0000 mg | ORAL_CAPSULE | ORAL | Status: DC

## 2015-06-21 MED ORDER — HYDROXYZINE HCL 25 MG PO TABS: 25.0000 mg | ORAL_TABLET | ORAL | Status: DC | PRN

## 2015-06-21 NOTE — Progress Notes (Signed)
[facility]  Arnoldo HookerMicah Peacock [id number] 20 y.o.  Chief Complaint: I'm doing fine    Diagnosis ------ADHD combined type, autism spectrum disorder and depressive disorder NOS presents for medication  History of Present Illness.--- Patient seen for medications follow-up, states that [facility] through 05/31/2015. Patient had overdosed on 20 pills of Naprosyn. States it was an impulsive action and he hadn't planned to do.  While hospitalized his Lexapro was discontinued and he was started on Zyprexa 5 mg daily and trazodone 50 mg every day Vistaril 25 mg when necessary and Adderall XR 25 mg.  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 States his sleep is good appetite is good mood has been stable denies feeling anxious no suicidal or homicidal ideation no hallucinations or delusions. His tolerating his medications well and coping well.   Substance abuse--- patient denies using alcohol, marijuana, cigarettes or other drugs.   Review of Systems  Constitutional: Negative.  Negative for fever, chills, weight loss and malaise/fatigue.  HENT: Negative.  Negative for congestion, ear discharge, ear pain, hearing loss, nosebleeds, sore throat and tinnitus.   Eyes: Negative.  Negative for blurred vision, double vision, discharge and redness.  Respiratory: Negative.  Negative for cough, hemoptysis, sputum production, shortness of breath and wheezing.   Cardiovascular: Positive for PND. Negative for chest pain, palpitations, orthopnea, claudication and leg swelling.  Gastrointestinal: Negative.  Negative for heartburn, nausea, vomiting, abdominal pain, diarrhea and constipation.  Genitourinary: Negative.  Negative for dysuria, urgency, frequency, hematuria and flank pain.  Musculoskeletal: Positive for joint pain. Negative for myalgias, back pain, falls and neck pain.  Skin: Negative.   Negative for rash.  Neurological: Negative.  Negative for dizziness, tingling, tremors, sensory change, speech change, focal weakness, seizures, loss of consciousness, weakness and headaches.  Endo/Heme/Allergies: Negative.  Negative for environmental allergies and polydipsia. Does not bruise/bleed easily.  Psychiatric/Behavioral: Negative.  Negative for depression, suicidal ideas, hallucinations, memory loss and substance abuse. The patient is not nervous/anxious and does not have insomnia.     Past Medical Family, Social History: Lives with his family and has graduated high school  Outpatient Encounter Prescriptions as of 06/21/2015  Medication Sig  . amphetamine-dextroamphetamine (ADDERALL XR) 25 MG 24 hr capsule Take 2 capsules by mouth every morning. For ADHD  . hydrOXYzine (ATARAX/VISTARIL) 25 MG tablet Take 1 tablet (25 mg total) by mouth every 4 (four) hours as needed for anxiety (Sleep).  . OLANZapine (ZYPREXA) 5 MG tablet Take 1 tablet (5 mg total) by mouth at bedtime. For mood control  . traZODone (DESYREL) 50 MG tablet Take 1 tablet (50 mg total) by mouth at bedtime. For sleep   No facility-administered encounter medications on file as of 06/21/2015.    Past Psychiatric History/Hospitalization(s): Anxiety: No Bipolar Disorder: No Depression: Yes Mania: No Psychosis: No Schizophrenia: No Personality Disorder: No Hospitalization for psychiatric illness: No History of Electroconvulsive Shock Therapy: No Prior Suicide Attempts: No  Physical Exam: Constitutional:  BP 110/64 mmHg  Pulse 87  Ht 5\' 5"  (1.651 m)  Wt 133 lb (60.328 kg)  BMI 22.13 kg/m2  General Appearance: alert, oriented, no acute distress  Musculoskeletal: Strength & Muscle Tone: within normal limits Gait & Station: normal Patient leans: N/A  Psychiatric: Speech (describe rate, volume, coherence, spontaneity, and abnormalities if any): Normal in volume, rate, tone, spontaneous   Thought Process  (describe rate, content, abstract reasoning,  and computation): Organized, goal directed, age appropriate   Associations: Intact  Thoughts: normal  Mental Status: Orientation: oriented to person, place and situation Mood & Affect: Calm and stable Attention Span & Concentration: Good/good Cognition: Intact Recent and remote memories: Are intact and age-appropriate Insight and judgment: Seems fair at this visit Language: [employer] of knowledge: Fair Suicidal Ideation: No Plan Formed: No Patient has means to carry out plan: No  Homicidal Ideation: No Plan Formed: No Patient has means to carry out plan: No  Medical Decision Making (Choose Three): Established Problem, Stable/Improving (1), Review of Psycho-Social Stressors (1), Review of Last Therapy Session (1) and Review of Medication Regimen & Side Effects (2)  Assessment: Diagnosis  #1 ADHD combined type.  #2 autism spectrum disorder.  #3 ODD. #4 depressive disorder NOS  Plan: #1 ADHD  Continue Adderall XR 25 mg twice daily for ADHD inattentive type  #2 Depression DC Lexapro 20 mg po q am. As this was done on the inpatient unit. Continue Zyprexa 5 mg by mouth daily this was started on the inpatient unit  #3 insomnia Continue trazodone 50 mg by mouth daily at bedtime this was done on the inpatient unit.  #4 anxiety Continue Vistaril 25 mg by mouth when necessary   #5 discussed with the patient that I am leaving the clinic and that he'll will be referred to Dr. are faint for follow-up he stated understanding and is willing to do so. He'll return to the clinic to see Dr.Arfeen in 2 months.  50% of this visit was spent in discussing medication compliance and also developing organizational skills, making lists of things, medication compliance interpersonal and supportive therapy was provided.    Margit Banda, MD

## 2015-07-22 ENCOUNTER — Other Ambulatory Visit (HOSPITAL_COMMUNITY): Payer: Self-pay

## 2015-07-22 DIAGNOSIS — F988 Other specified behavioral and emotional disorders with onset usually occurring in childhood and adolescence: Secondary | ICD-10-CM

## 2015-07-22 MED ORDER — AMPHETAMINE-DEXTROAMPHET ER 25 MG PO CP24: 50.0000 mg | ORAL_CAPSULE | ORAL | Status: DC

## 2015-07-22 NOTE — Telephone Encounter (Signed)
Patient called, the quantity was wrong on her last prescription. I spoke to Dr. Ladona Ridgelaylor who agreed to sign a new order. It was printed and patient was called to come and pick up

## 2015-08-12 ENCOUNTER — Other Ambulatory Visit (HOSPITAL_COMMUNITY): Payer: Self-pay | Admitting: Psychiatry

## 2015-08-12 ENCOUNTER — Telehealth (HOSPITAL_COMMUNITY): Payer: Self-pay

## 2015-08-12 DIAGNOSIS — F988 Other specified behavioral and emotional disorders with onset usually occurring in childhood and adolescence: Secondary | ICD-10-CM

## 2015-08-12 NOTE — Telephone Encounter (Signed)
We can provide one week supply until he will be seen.

## 2015-08-12 NOTE — Telephone Encounter (Signed)
Patient has an appointment with you on 7/3, but will run out of his adderall on 7/1, he would like to pick up an order sometime next week. This is a transfer from Dr. Rutherford Limerickadepalli. Please review and advise, thank you

## 2015-08-17 MED ORDER — AMPHETAMINE-DEXTROAMPHET ER 25 MG PO CP24: 50.0000 mg | ORAL_CAPSULE | ORAL | Status: DC

## 2015-08-17 NOTE — Telephone Encounter (Signed)
Printed and will call patient to come and pick up

## 2015-08-23 ENCOUNTER — Encounter (HOSPITAL_COMMUNITY): Payer: Self-pay | Admitting: Psychiatry

## 2015-08-23 ENCOUNTER — Ambulatory Visit (INDEPENDENT_AMBULATORY_CARE_PROVIDER_SITE_OTHER): Payer: BLUE CROSS/BLUE SHIELD | Admitting: Psychiatry

## 2015-08-23 VITALS — BP 118/64 | HR 98 | Ht 65.5 in | Wt 135.4 lb

## 2015-08-23 DIAGNOSIS — F988 Other specified behavioral and emotional disorders with onset usually occurring in childhood and adolescence: Secondary | ICD-10-CM

## 2015-08-23 DIAGNOSIS — F902 Attention-deficit hyperactivity disorder, combined type: Secondary | ICD-10-CM

## 2015-08-23 DIAGNOSIS — F3342 Major depressive disorder, recurrent, in full remission: Secondary | ICD-10-CM | POA: Diagnosis not present

## 2015-08-23 MED ORDER — HYDROXYZINE HCL 25 MG PO TABS: 25.0000 mg | ORAL_TABLET | Freq: Every day | ORAL | Status: DC

## 2015-08-23 MED ORDER — OLANZAPINE 5 MG PO TABS: 5.0000 mg | ORAL_TABLET | Freq: Every day | ORAL | Status: DC

## 2015-08-23 MED ORDER — TRAZODONE HCL 50 MG PO TABS: 50.0000 mg | ORAL_TABLET | Freq: Every day | ORAL | Status: DC

## 2015-08-23 MED ORDER — AMPHETAMINE-DEXTROAMPHET ER 25 MG PO CP24: 50.0000 mg | ORAL_CAPSULE | ORAL | Status: DC

## 2015-08-23 NOTE — Progress Notes (Signed)
[place] [id number] Progress Note  Jahmere Bramel [id number] 20 y.o.  Chief Complaint: I need my medication.  I'm doing fine.    History of Present Illness.  Patient is 20 year old Caucasian single man who came for his follow-up appointment.  Patient was seen by Dr.  Gildardo Griffes in the past who left the practice.  Patient has history of major depressive disorder and ADHD.  He is taking multiple medication.  His last psychiatric hospitalization was in April 2017 after taking overdose on Naprosyn.  Patient told he was very depressed and a lot of things going on in his life at bedtime.  Patient appears somewhat labile and minimum to provide information.  Most of the information was obtained from electronic medical record.  He endorsed things are going very well and he is taking rest from the school.  Like to restart schooling hopefully this year.  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  He lives with his parents and siblings.  He endorse much improved relationship with his siblings and his parents.  He denies any paranoia, hallucination, severe anger issues or any panic attack.  His attention and concentration is fair.  He has no tremors, shakes or any EPS.  His appetite is okay.  Vital signs are stable.  He is taking Zyprexa 5 mg at bedtime, trazodone 50 mg at bedtime and Vistaril 25 mg at bedtime.  Is taking Adderall X are 25 mg 2 tablet denies any shakes tremors or any other concern.  Patient denies drinking alcohol or using any illegal substances.     Review of Systems  Constitutional: Negative.  Negative for weight loss.  HENT: Negative.   Eyes: Negative.  Negative for blurred vision.  Respiratory: Negative.   Cardiovascular: Negative for chest pain and palpitations.  Gastrointestinal: Negative.  Negative for abdominal pain.  Genitourinary: Negative.  Negative for frequency.  Musculoskeletal: Negative for falls.  Skin: Negative.  Negative for rash.  Neurological: Negative.  Negative  for dizziness, tremors and headaches.  Endo/Heme/Allergies: Negative.   Psychiatric/Behavioral: Negative.     Past Medical Family, Social History:  Lives with his parents and 2 younger siblings.  He was attending college at [employer] but now taking the time off .  He like to restart school in fall.   Outpatient Encounter Prescriptions as of 08/23/2015  Medication Sig  . amphetamine-dextroamphetamine (ADDERALL XR) 25 MG 24 hr capsule Take 2 capsules by mouth every morning. For ADHD  . hydrOXYzine (ATARAX/VISTARIL) 25 MG tablet Take 1 tablet (25 mg total) by mouth at bedtime.  Marland Kitchen OLANZapine (ZYPREXA) 5 MG tablet Take 1 tablet (5 mg total) by mouth at bedtime. For mood control  . traZODone (DESYREL) 50 MG tablet Take 1 tablet (50 mg total) by mouth at bedtime. For sleep  . [DISCONTINUED] amphetamine-dextroamphetamine (ADDERALL XR) 25 MG 24 hr capsule Take 2 capsules by mouth every morning. For ADHD  . [DISCONTINUED] amphetamine-dextroamphetamine (ADDERALL XR) 25 MG 24 hr capsule Take 2 capsules by mouth every morning. For ADHD  . [DISCONTINUED] hydrOXYzine (ATARAX/VISTARIL) 25 MG tablet Take 1 tablet (25 mg total) by mouth every 4 (four) hours as needed for anxiety (Sleep).  . [DISCONTINUED] OLANZapine (ZYPREXA) 5 MG tablet Take 1 tablet (5 mg total) by mouth at bedtime. For mood control  . [DISCONTINUED] traZODone (DESYREL) 50 MG tablet Take 1 tablet (50 mg total) by mouth at bedtime. For sleep   No facility-administered encounter medications on file as of 08/23/2015.    Past  Psychiatric History/Hospitalization(s): Patient was admitted to behavioral [facility] in April 2017.  He had tried Lexapro in the past which was discontinued when he was in the hospital.  Patient has history of taking overdose on Naprosyn.  He denies any delusion, psychosis or any hallucination.  Patient has been seen in this office for many years.  He has seen Dr. Lovena Le, Dr. Dwyane Dee and recently Dr. Gildardo Griffes. Anxiety:  No Bipolar Disorder: No Depression: Yes Mania: No Psychosis: No Schizophrenia: No Personality Disorder: No Hospitalization for psychiatric illness: No History of Electroconvulsive Shock Therapy: No Prior Suicide Attempts: No  Physical Exam: Constitutional:  BP 118/64 mmHg  Pulse 98  Ht 5' 5.5" (1.664 m)  Wt 135 lb 6.4 oz (61.417 kg)  BMI 22.18 kg/m2  Recent Results (from the past 2160 hour(s))  CBC     Status: None   Collection Time: 05/26/15  8:30 AM  Result Value Ref Range   WBC 7.1 4.0 - 10.5 K/uL   RBC 5.37 4.22 - 5.81 MIL/uL   Hemoglobin 15.8 13.0 - 17.0 g/dL   HCT 45.2 39.0 - 52.0 %   MCV 84.2 78.0 - 100.0 fL   MCH 29.4 26.0 - 34.0 pg   MCHC 35.0 30.0 - 36.0 g/dL   RDW 13.6 11.5 - 15.5 %   Platelets 285 150 - 400 K/uL  Comprehensive metabolic panel     Status: None   Collection Time: 05/26/15  8:30 AM  Result Value Ref Range   Sodium 139 135 - 145 mmol/L   Potassium 4.1 3.5 - 5.1 mmol/L   Chloride 102 101 - 111 mmol/L   CO2 26 22 - 32 mmol/L   Glucose, Bld 83 65 - 99 mg/dL   BUN 14 6 - 20 mg/dL   Creatinine, Ser 0.99 0.61 - 1.24 mg/dL   Calcium 10.0 8.9 - 10.3 mg/dL   Total Protein 7.9 6.5 - 8.1 g/dL   Albumin 5.0 3.5 - 5.0 g/dL   AST 30 15 - 41 U/L   ALT 31 17 - 63 U/L   Alkaline Phosphatase 88 38 - 126 U/L   Total Bilirubin 1.1 0.3 - 1.2 mg/dL   GFR calc non Af Amer >60 >60 mL/min   GFR calc Af Amer >60 >60 mL/min    Comment: (NOTE) The eGFR has been calculated using the CKD EPI equation. This calculation has not been validated in all clinical situations. eGFR's persistently <60 mL/min signify possible Chronic Kidney Disease.    Anion gap 11 5 - 15  Ethanol     Status: None   Collection Time: 05/26/15  8:30 AM  Result Value Ref Range   Alcohol, Ethyl (B) <5 <5 mg/dL    Comment:        LOWEST DETECTABLE LIMIT FOR SERUM ALCOHOL IS 5 mg/dL FOR MEDICAL PURPOSES ONLY   Salicylate level     Status: None   Collection Time: 05/26/15  8:30 AM   Result Value Ref Range   Salicylate Lvl <2.5 2.8 - 30.0 mg/dL  Acetaminophen level     Status: Abnormal   Collection Time: 05/26/15  8:30 AM  Result Value Ref Range   Acetaminophen (Tylenol), Serum <10 (L) 10 - 30 ug/mL    Comment:        THERAPEUTIC CONCENTRATIONS VARY SIGNIFICANTLY. A RANGE OF 10-30 ug/mL MAY BE AN EFFECTIVE CONCENTRATION FOR MANY PATIENTS. HOWEVER, SOME ARE BEST TREATED AT CONCENTRATIONS OUTSIDE THIS RANGE. ACETAMINOPHEN CONCENTRATIONS >150 ug/mL AT 4 HOURS AFTER INGESTION  AND >50 ug/mL AT 12 HOURS AFTER INGESTION ARE OFTEN ASSOCIATED WITH TOXIC REACTIONS.   Urine rapid drug screen (hosp performed)     Status: Abnormal   Collection Time: 05/26/15 10:45 AM  Result Value Ref Range   Opiates NONE DETECTED NONE DETECTED   Cocaine NONE DETECTED NONE DETECTED   Benzodiazepines NONE DETECTED NONE DETECTED   Amphetamines POSITIVE (A) NONE DETECTED   Tetrahydrocannabinol NONE DETECTED NONE DETECTED   Barbiturates NONE DETECTED NONE DETECTED    Comment:        DRUG SCREEN FOR MEDICAL PURPOSES ONLY.  IF CONFIRMATION IS NEEDED FOR ANY PURPOSE, NOTIFY LAB WITHIN 5 DAYS.        LOWEST DETECTABLE LIMITS FOR URINE DRUG SCREEN Drug Class       Cutoff (ng/mL) Amphetamine      1000 Barbiturate      200 Benzodiazepine   621 Tricyclics       308 Opiates          300 Cocaine          300 THC              50   Prolactin     Status: Abnormal   Collection Time: 05/30/15  6:27 PM  Result Value Ref Range   Prolactin 19.6 (H) 4.0 - 15.2 ng/mL    Comment: (NOTE) Performed At: [facility] [place], [place] [id number] Lindon Romp MD [id number] Performed at [facility]   Hemoglobin A1c     Status: None   Collection Time: 05/31/15  6:28 AM  Result Value Ref Range   Hgb A1c MFr Bld 5.2 4.8 - 5.6 %    Comment: (NOTE)         Pre-diabetes: 5.7 - 6.4         Diabetes: >6.4         Glycemic control for adults with  diabetes: <7.0    Mean Plasma Glucose 103 mg/dL    Comment: (NOTE) Performed At: [facility] [place], [place] [id number] Lindon Romp MD [id number] Performed at [facility]   Lipid panel     Status: Abnormal   Collection Time: 05/31/15  6:28 AM  Result Value Ref Range   Cholesterol 176 0 - 200 mg/dL   Triglycerides 273 (H) <150 mg/dL   HDL 47 >40 mg/dL   Total CHOL/HDL Ratio 3.7 RATIO   VLDL 55 (H) 0 - 40 mg/dL   LDL Cholesterol 74 0 - 99 mg/dL    Comment:        Total Cholesterol/HDL:CHD Risk Coronary Heart Disease Risk Table                     Men   Women  1/2 Average Risk   3.4   3.3  Average Risk       5.0   4.4  2 X Average Risk   9.6   7.1  3 X Average Risk  23.4   11.0        Use the calculated Patient Ratio above and the CHD Risk Table to determine the patient's CHD Risk.        ATP III CLASSIFICATION (LDL):  <100     mg/dL   Optimal  100-129  mg/dL   Near or Above                    Optimal  130-159  mg/dL   Borderline  160-189  mg/dL   High  >190     mg/dL   Very High Performed at [facility] Appearance: alert, oriented, no acute distress  Musculoskeletal: Strength & Muscle Tone: within normal limits Gait & Station: normal Patient leans: N/A  Psychiatric: Speech (describe rate, volume, coherence, spontaneity, and abnormalities if any): Normal in volume, rate, tone, spontaneous   Thought Process (describe rate, content, abstract reasoning, and computation): Organized, goal directed, age appropriate   Associations: Intact  Thoughts: normal  Mental Status: Orientation: oriented to person, place and situation Mood & Affect: Anxious and stable Attention Span & Concentration: fair  Cognition: Intact Recent and remote memories: Are intact and age-appropriate Insight and judgment: Seems fair at this visit Language: [employer] of knowledge: Fair Suicidal Ideation: No Plan Formed: No  Patient has means to carry out plan: No  Homicidal Ideation: No Plan Formed: No Patient has means to carry out plan: No  Established Problem, Stable/Improving (1), Review of Psycho-Social Stressors (1), Review or order clinical lab tests (1), Review and summation of old records (2), Review of Last Therapy Session (1) and Review of Medication Regimen & Side Effects (2)  Assessment: Diagnosis  #1 ADHD combined type.  #2 autism spectrum disorder.  #3 ODD. #4 depressive disorder NOS  Plan: I reviewed records from previous provider, discharge summary, blood work results and current medication.  Patient is taking olanzapine, trazodone, Vistaril and Adderall.  He does not want to change or reduce any medication.  I will continue Adderall XR 25 mg 2 tablet daily to help the ADD symptoms.  Continue Zyprexa 5 mg at bedtime, Vistaril 25 mg at bedtime and trazodone 50 mg at bedtime.  I recommended to take trazodone or Vistaril only as needed.  Discussed medication side effects especially metabolic syndrome with Zyprexa.  We will schedule appointment to see a therapist for counseling.  Discussed medication side effects.  Recommended to call us back if he has any question, concern or worsening of the symptom.  Follow-up in 2 months.  Discuss safety plan that anytime having active suicidal thoughts or homicidal or any need to call 911 or go to the local emergency room.  Wiley Magan T., MD

## 2015-11-01 ENCOUNTER — Ambulatory Visit (HOSPITAL_COMMUNITY): Payer: Self-pay | Admitting: Psychiatry

## 2015-11-08 ENCOUNTER — Other Ambulatory Visit (HOSPITAL_COMMUNITY): Payer: Self-pay

## 2015-11-08 DIAGNOSIS — F3342 Major depressive disorder, recurrent, in full remission: Secondary | ICD-10-CM

## 2015-11-08 MED ORDER — OLANZAPINE 5 MG PO TABS: 5.0000 mg | ORAL_TABLET | Freq: Every day | ORAL | 1 refills | Status: DC

## 2015-11-08 MED ORDER — HYDROXYZINE HCL 25 MG PO TABS: 25.0000 mg | ORAL_TABLET | Freq: Every day | ORAL | 1 refills | Status: DC

## 2015-11-08 MED ORDER — TRAZODONE HCL 50 MG PO TABS: 50.0000 mg | ORAL_TABLET | Freq: Every day | ORAL | 1 refills | Status: DC

## 2015-11-08 NOTE — Progress Notes (Signed)
Patient called for a refill on medications, these were appropriate and patient has a follow up in November. An order for one month with a refill was sent to the pharmacy on 3 of his medications, patient also needs Adderall, is this okay to print? Please review and advise, thank you

## 2015-11-11 ENCOUNTER — Encounter (HOSPITAL_COMMUNITY): Payer: Self-pay | Admitting: Psychiatry

## 2015-11-11 ENCOUNTER — Ambulatory Visit (INDEPENDENT_AMBULATORY_CARE_PROVIDER_SITE_OTHER): Payer: BLUE CROSS/BLUE SHIELD | Admitting: Psychiatry

## 2015-11-11 VITALS — BP 116/68 | HR 97 | Ht 66.5 in | Wt 136.2 lb

## 2015-11-11 DIAGNOSIS — F3342 Major depressive disorder, recurrent, in full remission: Secondary | ICD-10-CM

## 2015-11-11 DIAGNOSIS — F909 Attention-deficit hyperactivity disorder, unspecified type: Secondary | ICD-10-CM | POA: Diagnosis not present

## 2015-11-11 DIAGNOSIS — F988 Other specified behavioral and emotional disorders with onset usually occurring in childhood and adolescence: Secondary | ICD-10-CM

## 2015-11-11 MED ORDER — OLANZAPINE 10 MG PO TABS: 10.0000 mg | ORAL_TABLET | Freq: Every day | ORAL | 1 refills | Status: DC

## 2015-11-11 MED ORDER — AMPHETAMINE-DEXTROAMPHETAMINE 20 MG PO TABS: 40.0000 mg | ORAL_TABLET | Freq: Every day | ORAL | 0 refills | Status: DC

## 2015-11-11 MED ORDER — HYDROXYZINE HCL 25 MG PO TABS
25.0000 mg | ORAL_TABLET | Freq: Every day | ORAL | 1 refills | Status: DC
Start: 2015-11-11 — End: 2016-01-20

## 2015-11-11 NOTE — Progress Notes (Signed)
Patient ID: Terry Hutchinson, male   DOB: 1995-12-04, 20 y.o.   MRN: [id number]  [facility] [id number] Progress Note  Kain Milosevic [id number] 20 y.o.  11/11/2015 2:56 PM  Chief Complaint:  I am in trouble.  I'm depressed.  History of Present Illness:  Ira came for his follow-up appointment with his adopted stepfather.  He was last seen in July 2017.  As per his father he's been poorly compliant with his medication and lately more irritable, angry and upset.  His father told he was involved sending pornographic picture to a minor .  Though he was not formally charged as a Retail buyer requested a letter describing his mental disorder to prevent charges.  His father is concerned about his condition because lack of his responsibility , behavior and noncompliant with medication he's been slowly regressing.  Patient has admitted in April 2017 at behavioral [facility] due to suicidal thoughts.  Patient also started school at [employer] but he is concerned because grades are not going very well.  He wants to do cyber defense .  He admitted there are few times when he has not taken his medication.  He sleep on and off.  He denies any paranoia or any hallucination but admitted feeling scared, anxious nervous and depressed.  Patient remains very labile and minimally cooperative.  He lives with his parents and siblings.  His father denies any major aggression but concerned about his lack of responsibility.  Patient denies drinking or using any illegal substances.  He is taking Zyprexa 5 mg at bedtime, trazodone 50 mg at bedtime Vistaril 25 mg at bedtime and Adderall 50 mg in the morning.  However medicine compliance is questionable.  His energy level is good.  His vital signs are good.    Suicidal Ideation: No Plan Formed: No Patient has means to carry out plan: No  Homicidal Ideation: No Plan Formed: No Patient has means to carry out plan: No  Medical History; Patient has no active medical  problems.  Family History; Patient denies any family history of psychiatric illness.  Education and Work History; Patient is started school this fall.  He is going to [employer].  Psychosocial History; Patient lives with his biological mother and adopted father.  Substance Abuse History; Patient denies any history of drug use.  Past Psychiatric History/Hospitalization(s) [facility].  He had tried Lexapro in the past which was discontinued when he was in the hospital.  Patient has history of taking overdose on Naprosyn.  He denies any delusion, psychosis or any hallucination.  Patient has been seen in this office for many years.  He has seen Dr. Ladona Ridgel, Dr. Lucianne Muss and recently Dr. Jarold Motto. Anxiety: No Bipolar Disorder: No Depression: Yes Mania: No Psychosis: No Schizophrenia: No Personality Disorder: No Hospitalization for psychiatric illness: Yes History of Electroconvulsive Shock Therapy: No Prior Suicide Attempts: Yes   Review of Systems: Psychiatric: Agitation: Yes Hallucination: No Depressed Mood: Yes Insomnia: Yes Hypersomnia: No Altered Concentration: No Feels Worthless: No Grandiose Ideas: No Belief In Special Powers: No New/Increased Substance Abuse: No Compulsions: No  Neurologic: Headache: No Seizure: No Paresthesias: No  Outpatient Encounter Prescriptions as of 11/11/2015  Medication Sig  . amphetamine-dextroamphetamine (ADDERALL) 20 MG tablet Take 2 tablets (40 mg total) by mouth daily. Do not refill until 91021/17  . hydrOXYzine (ATARAX/VISTARIL) 25 MG tablet Take 1 tablet (25 mg total) by mouth at bedtime.  Marland Kitchen OLANZapine (ZYPREXA) 10 MG tablet  Take 1 tablet (10 mg total) by mouth at bedtime. For mood control  . [DISCONTINUED] amphetamine-dextroamphetamine (ADDERALL XR) 25 MG 24 hr capsule Take 2 capsules by mouth every morning. For ADHD  . [DISCONTINUED] amphetamine-dextroamphetamine (ADDERALL) 20 MG tablet  Take 2 tablets (40 mg total) by mouth daily. Do not refill until 11/11/15  . [DISCONTINUED] hydrOXYzine (ATARAX/VISTARIL) 25 MG tablet Take 1 tablet (25 mg total) by mouth at bedtime.  . [DISCONTINUED] OLANZapine (ZYPREXA) 5 MG tablet Take 1 tablet (5 mg total) by mouth at bedtime. For mood control  . [DISCONTINUED] traZODone (DESYREL) 50 MG tablet Take 1 tablet (50 mg total) by mouth at bedtime. For sleep   No facility-administered encounter medications on file as of 11/11/2015.     No results found for this or any previous visit (from the past 2160 hour(s)).  Physical Exam: Consitutional ;  BP 116/68   Pulse 97   Ht 5' 6.5" (1.689 m)   Wt 136 lb 3.2 oz (61.8 kg)   BMI 21.65 kg/m   Musculoskeletal: Strength & Muscle Tone: within normal limits Gait & Station: normal Patient leans: N/A   Psychiatric Specialty Exam: General Appearance: Guarded  Eye Contact::  Fair  Speech:  Slow  Volume:  Decreased  Mood:  Anxious and Depressed  Affect:  Appropriate  Thought Process:  Linear  Orientation:  Full (Time, Place, and Person)  Thought Content:  Logical  Suicidal Thoughts:  No  Homicidal Thoughts:  No  Memory:  Immediate;   Fair Recent;   Fair Remote;   Fair  Judgement:  Fair  Insight:  Fair  Psychomotor Activity:  Decreased  Concentration:  Fair  Recall:  [employer]  Fund of Knowledge:  Fair  Language:  Fair  Akathisia:  No  Handed:  Right  AIMS (if indicated):     Assets:  Desire for Improvement Housing  ADL's:  Intact  Cognition:  WNL  Sleep:        New problem, with additional work up planned, Review of Psycho-Social Stressors (1), Review and summation of old records (2), Established Problem, Worsening (2), Review of Last Therapy Session (1), Review of Medication Regimen & Side Effects (2) and Review of New Medication or Change in Dosage (2)  Assessment: Axis I; ADHD combined type, autism Spectrum disorder, oppositional defiant disorder, major depressive disorder,  recurrent moderate  Axis III:  Past Medical History:  Diagnosis Date  . ADHD (attention deficit hyperactivity disorder)   . Anemia   . Asperger's disorder   . Depression   . History of chicken pox   . Migraine   . Oppositional defiant disorder     Plan:  I review his history, collateral information, current medication, psychosocial stressors.  I have a long discussion with the patient and his father.  Discuss compliance issue in detail.  Recommended to decrease Adderall to 40 mg a day.  Reduce his anxiety and insomnia.  I will also increase Zyprexa 10 mg at bedtime and discontinue trazodone.  Continue Vistaril 25 mg at bedtime for anxiety and nervousness.  He also talk about getting individual counseling and therapy for his impulsive behavior.  Patient will need a letter stating his diagnosis that can help preventing him for legal issues.  Discuss safety plan that anytime having active suicidal thoughts or homicidal thoughts and he need to call 911 or go to local emergency room.  Follow-up in 6 weeks to 2 months.  Zaviyar Rahal T., MD 11/11/2015

## 2015-11-15 ENCOUNTER — Telehealth (HOSPITAL_COMMUNITY): Payer: Self-pay

## 2015-11-15 NOTE — Telephone Encounter (Signed)
Medication problem - Reports medication was decreased but also changed and would like Adderall ER prescriptions for patient to be rewirtten and for 30 day orders as states new Adderall 20mg , 2 a day was for only 15 day supplies.

## 2015-11-15 NOTE — Telephone Encounter (Signed)
This is a patient of Dr. Lolly MustacheArfeen, he was here last week and got a refill on his adderall. Patients father called and stated that he picked it up from the pharmacy and was told that it was not ER which is what patient normally gets and that it was only 15 days worth. Would it be okay to print new prescription for Extended release? Please review and advise, thank you

## 2015-11-17 ENCOUNTER — Other Ambulatory Visit (HOSPITAL_COMMUNITY): Payer: Self-pay | Admitting: Psychiatry

## 2015-11-17 MED ORDER — AMPHETAMINE-DEXTROAMPHET ER 20 MG PO CP24: 40.0000 mg | ORAL_CAPSULE | Freq: Every day | ORAL | 0 refills | Status: DC

## 2015-11-17 MED ORDER — AMPHETAMINE-DEXTROAMPHET ER 20 MG PO CP24: 20.0000 mg | ORAL_CAPSULE | Freq: Every day | ORAL | 0 refills | Status: DC

## 2015-11-17 NOTE — Telephone Encounter (Signed)
Gryffin said he needed the XR version of Adderall so I wrote Adderall XR 20 mg take 2 daily # 60 with a refill for October as well as his next appointment is in November

## 2015-11-26 ENCOUNTER — Encounter: Payer: Self-pay | Admitting: Physician Assistant

## 2015-11-26 ENCOUNTER — Ambulatory Visit (INDEPENDENT_AMBULATORY_CARE_PROVIDER_SITE_OTHER): Payer: BLUE CROSS/BLUE SHIELD | Admitting: Physician Assistant

## 2015-11-26 ENCOUNTER — Ambulatory Visit (HOSPITAL_BASED_OUTPATIENT_CLINIC_OR_DEPARTMENT_OTHER)
Admission: RE | Admit: 2015-11-26 | Discharge: 2015-11-26 | Disposition: A | Discharge status: Home / Self Care | Payer: BLUE CROSS/BLUE SHIELD | Source: Ambulatory Visit | Attending: Physician Assistant | Admitting: Physician Assistant

## 2015-11-26 VITALS — BP 105/73 | HR 97 | Temp 97.9°F | Resp 16 | Ht 65.0 in | Wt 140.4 lb

## 2015-11-26 DIAGNOSIS — X58XXXA Exposure to other specified factors, initial encounter: Secondary | ICD-10-CM | POA: Insufficient documentation

## 2015-11-26 DIAGNOSIS — S6991XA Unspecified injury of right wrist, hand and finger(s), initial encounter: Secondary | ICD-10-CM | POA: Diagnosis not present

## 2015-11-26 NOTE — Progress Notes (Signed)
Pre visit review using our clinic review tool, if applicable. No additional management support is needed unless otherwise documented below in the visit note/SLS  

## 2015-11-26 NOTE — Patient Instructions (Signed)
No fracture which is great! Alternate tylenol and ibuprofen for pain. Wear the splint over the next 4-5 days as symptoms calm down. Elevate the hand while resting.

## 2015-11-26 NOTE — Progress Notes (Signed)
   Patient presents to clinic today c/o injury to third phalanx occurring last night after hitting hand on a "buckle" in his car while trying to fix it. Endorses hitting the object hard. Endorses pain and decreased ROM with swelling since that time. Denies bruising, numbness or tingling of phalanx. Has taken some OTC pain medication with some improvement in symptoms.  Past Medical History:  Diagnosis Date  . ADHD (attention deficit hyperactivity disorder)   . Anemia   . Asperger's disorder   . Depression   . History of chicken pox   . Migraine   . Oppositional defiant disorder     Current Outpatient Prescriptions on File Prior to Visit  Medication Sig Dispense Refill  . amphetamine-dextroamphetamine (ADDERALL XR) 20 MG 24 hr capsule Take 2 capsules (40 mg total) by mouth daily. Do not fill before 16 December 2015 60 capsule 0  . amphetamine-dextroamphetamine (ADDERALL XR) 20 MG 24 hr capsule Take 2 capsules (40 mg total) by mouth daily. 60 capsule 0  . hydrOXYzine (ATARAX/VISTARIL) 25 MG tablet Take 1 tablet (25 mg total) by mouth at bedtime. 30 tablet 1  . OLANZapine (ZYPREXA) 10 MG tablet Take 1 tablet (10 mg total) by mouth at bedtime. For mood control 30 tablet 1   No current facility-administered medications on file prior to visit.     Allergies  Allergen Reactions  . Peach [Prunus Persica] Swelling    Peach Fuzz  . Vantin Rash    Family History  Problem Relation Age of Onset  . Depression Mother   . ADD / ADHD Father   . Alcohol abuse Maternal Grandfather   . Depression Maternal Grandmother     Social History   Social History  . Marital status: Single    Spouse name: N/A  . Number of children: N/A  . Years of education: N/A   Social History Main Topics  . Smoking status: Never Smoker  . Smokeless tobacco: None  . Alcohol use No  . Drug use: No  . Sexual activity: No   Other Topics Concern  . None   Social History Narrative  . None   Review of Systems -  See HPI.  All other ROS are negative.  BP 105/73 (BP Location: Right Arm, Patient Position: Sitting, Cuff Size: Normal)   Pulse 97   Temp 97.9 F (36.6 C) (Oral)   Resp 16   Ht 5\' 5"  (1.651 m)   Wt 140 lb 6 oz (63.7 kg)   SpO2 98%   BMI 23.36 kg/m   Physical Exam  Constitutional: He is well-developed, well-nourished, and in no distress.  HENT:  Head: Normocephalic and atraumatic.  Eyes: Conjunctivae are normal.  Neck: Neck supple.  Cardiovascular: Normal rate, regular rhythm, normal heart sounds and intact distal pulses.   Pulmonary/Chest: Effort normal.  Musculoskeletal:       Right hand: He exhibits no swelling. Normal sensation noted. Normal strength noted.       Hands: Neurological: He is alert.  Skin: Skin is warm and dry. No rash noted.  Psychiatric: Affect normal.  Vitals reviewed.    Assessment/Plan: 1. Hand injury, right, initial encounter Significant tenderness. Decreased ROM but feel only 2/2 patient's pain level. No bony deformity. X-ray obtained and negative for fracture. Supportive measures and OTC medications reviewed. Finger splint placed for stabilization. FU if symptoms are not resolving. - DG Hand Complete Right; Future   Piedad ClimesMartin, Tawan Corkern Cody, PA-C

## 2015-11-30 ENCOUNTER — Other Ambulatory Visit (HOSPITAL_COMMUNITY): Payer: Self-pay

## 2015-11-30 DIAGNOSIS — F900 Attention-deficit hyperactivity disorder, predominantly inattentive type: Secondary | ICD-10-CM

## 2015-11-30 MED ORDER — AMPHETAMINE-DEXTROAMPHET ER 25 MG PO CP24: 50.0000 mg | ORAL_CAPSULE | ORAL | 0 refills | Status: DC

## 2015-12-01 ENCOUNTER — Telehealth (HOSPITAL_COMMUNITY): Payer: Self-pay

## 2015-12-01 NOTE — Telephone Encounter (Signed)
Christen picked up prescription on 16/11/9608/11/17  lic [id number]  dlo

## 2015-12-14 ENCOUNTER — Ambulatory Visit (HOSPITAL_COMMUNITY): Payer: Self-pay | Admitting: Clinical

## 2015-12-14 ENCOUNTER — Ambulatory Visit (INDEPENDENT_AMBULATORY_CARE_PROVIDER_SITE_OTHER): Payer: BLUE CROSS/BLUE SHIELD | Admitting: Clinical

## 2015-12-14 DIAGNOSIS — F840 Autistic disorder: Secondary | ICD-10-CM | POA: Diagnosis not present

## 2015-12-14 DIAGNOSIS — F988 Other specified behavioral and emotional disorders with onset usually occurring in childhood and adolescence: Secondary | ICD-10-CM

## 2015-12-14 DIAGNOSIS — F331 Major depressive disorder, recurrent, moderate: Secondary | ICD-10-CM

## 2015-12-15 ENCOUNTER — Encounter (HOSPITAL_COMMUNITY): Payer: Self-pay | Admitting: Clinical

## 2015-12-15 NOTE — Progress Notes (Signed)
Comprehensive Clinical Assessment (CCA) Note  12/15/2015 Arnoldo HookerMicah Trenkamp [id number]  Visit Diagnosis:      ICD-9-CM ICD-10-CM   1. Major depressive disorder, recurrent episode, moderate (HCC) 296.32 F33.1   2. Autism spectrum disorder 299.00 F84.0   3. Attention deficit disorder, unspecified hyperactivity presence 314.00 F98.8       CCA Part One  Part One has been completed on paper by the patient.  (See scanned document in Chart Review)  CCA Part Two A  Intake/Chief Complaint:  CCA Intake With Chief Complaint CCA Part Two Date: 12/14/15 CCA Part Two Time: 1200 Chief Complaint/Presenting Problem: Depression, ADD Autism spectrum _ Patients Currently Reported Symptoms/Problems: College -[employer], was not doing work, need to find a new job, trouble with social cues, trouble concentrating, suicide attempt in April 2017 Individual's Strengths: "I am smart." Individual's Preferences: "A better way of living." Type of Services Patient Feels Are Needed: Individual therapy  Mental Health Symptoms Depression:  Depression: Change in energy/activity, Difficulty Concentrating, Fatigue, Hopelessness, Tearfulness, Worthlessness  Mania:  Mania: N/A  Anxiety:   Anxiety: N/A  Psychosis:  Psychosis: N/A  Trauma:  Trauma: N/A  Obsessions:  Obsessions: N/A  Compulsions:  Compulsions: N/A  Inattention:  Inattention: Fails to pay attention/makes careless mistakes, Poor follow-through on tasks (Reports Diagnosis as a child)  Hyperactivity/Impulsivity:  Hyperactivity/Impulsivity: N/A  Oppositional/Defiant Behaviors:  Oppositional/Defiant Behaviors: N/A  Borderline Personality:     Other Mood/Personality Symptoms:  Other Mood/Personality Symtpoms: Autism - hard to pick up social cues, specific focus, "I am not sure of all my symptoms but I was dianosised sometime ago, I don't remember when."   Mental Status Exam Appearance and self-care  Stature:  Stature: Average  Weight:  Weight: Average weight   Clothing:  Clothing: Casual  Grooming:  Grooming: Normal  Cosmetic use:  Cosmetic Use: None  Posture/gait:  Posture/Gait: Normal  Motor activity:  Motor Activity: Restless  Sensorium  Attention:  Attention: Normal  Concentration:  Concentration: Normal  Orientation:  Orientation: X5  Recall/memory:  Recall/Memory: Normal  Affect and Mood  Affect:  Affect: Appropriate  Mood:  Mood: Anxious  Relating  Eye contact:  Eye Contact: Normal  Facial expression:  Facial Expression: Anxious  Attitude toward examiner:  Attitude Toward Examiner: Cooperative  Thought and Language  Speech flow: Speech Flow: Normal  Thought content:  Thought Content: Appropriate to mood and circumstances  Preoccupation:     Hallucinations:     Organization:     Company secretaryxecutive Functions  Fund of Knowledge:  Fund of Knowledge: Average  Intelligence:  Intelligence: Average  Abstraction:  Abstraction: Functional  Judgement:  Judgement: Poor  Reality Testing:  Reality Testing: Realistic  Insight:  Insight: Fair  Decision Making:  Decision Making: Impulsive  Social Functioning  Social Maturity:  Social Maturity: Isolates  Social Judgement:  Social Judgement: Naive  Stress  Stressors:     Coping Ability:  Coping Ability: Building surveyorverwhelmed  Skill Deficits:     Supports:      Family and Psychosocial History: Family history Marital status: Single Are you sexually active?: No What is your sexual orientation?: Hetrosexual  Has your sexual activity been affected by drugs, alcohol, medication, or emotional stress?: no Does patient have children?: No  Childhood History:  Childhood History By whom was/is the patient raised?: Both parents Additional childhood history information: Childhood was it was fine.  Description of patient's relationship with caregiver when they were a child: good relationship with parents growing up; father worked a lot Patient's  description of current relationship with people who raised him/her:  It's fine - It just there.  How were you disciplined when you got in trouble as a child/adolescent?: Grounded or spanked or both Does patient have siblings?: Yes Number of Siblings: 2 Description of patient's current relationship with siblings: good relationship with siblings- both live in the home Did patient suffer any verbal/emotional/physical/sexual abuse as a child?: No Did patient suffer from severe childhood neglect?: No Has patient ever been sexually abused/assaulted/raped as an adolescent or adult?: No Was the patient ever a victim of a crime or a disaster?: No Witnessed domestic violence?: No Has patient been effected by domestic violence as an adult?: No  CCA Part Two B  Employment/Work Situation: Employment / Work Psychologist, occupational Employment situation: Employed 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  How long has patient been employed?: 2 months  Patient's job has been impacted by current illness: No What is the longest time patient has a held a job?: 2015 - [employer] Where was the patient employed at that time?: [employer] Has patient ever been in the [employer]?: No Are There Guns or Other Weapons in Your Home?: No  Education: Education Name of [employer] School: [employer] Did Garment/textile technologist From [employer]?: Yes Did Theme park manager?: Yes What Type of College Degree Do you Have?: working on a degree - Programming Did You Attend Graduate School?: No Did You Have An Individualized Education Program (IIEP): Yes (Autism) Did You Have Any Difficulty At School?: No  Religion: Religion/Spirituality Are You A Religious Person?: No How Might This Affect Treatment?: N/A  Leisure/Recreation: Leisure / Recreation Leisure and Hobbies: "Video Games"  Exercise/Diet: Exercise/Diet Do You Exercise?: No Have You Gained or Lost A Significant Amount of Weight in the Past Six Months?: Yes-Gained Number of Pounds Gained: 20 Do You Follow a Special Diet?:  Yes Do You Have Any Trouble Sleeping?: Yes Explanation of Sleeping Difficulties: Trouble falling asleep.  CCA Part Two C  Alcohol/Drug Use: Alcohol / Drug Use Pain Medications: See chart Prescriptions: See chart Over the Counter: See chart History of alcohol / drug use?: No history of alcohol / drug abuse                      CCA Part Three  ASAM's:  Six Dimensions of Multidimensional Assessment  Dimension 1:  Acute Intoxication and/or Withdrawal Potential:     Dimension 2:  Biomedical Conditions and Complications:     Dimension 3:  Emotional, Behavioral, or Cognitive Conditions and Complications:     Dimension 4:  Readiness to Change:     Dimension 5:  Relapse, Continued use, or Continued Problem Potential:     Dimension 6:  Recovery/Living Environment:      Substance use Disorder (SUD)    Social Function:  Social Functioning Social Maturity: Isolates Social Judgement: Naive  Stress:  Stress Coping Ability: Overwhelmed Patient Takes Medications The Way The Doctor Instructed?: Yes Priority Risk: Moderate Risk  Risk Assessment- Self-Harm Potential: Risk Assessment For Self-Harm Potential Thoughts of Self-Harm: No current thoughts Method: No plan Availability of Means: No access/NA Additional Comments for Self-Harm Potential: 1 previous March 2017 - Took a bunch of pills.   Risk Assessment -Dangerous to Others Potential: Risk Assessment For Dangerous to Others Potential Method: No Plan Availability of Means: No access or NA Intent: Vague intent or NA Notification Required: No need or identified person  DSM5 Diagnoses: Patient Active Problem List  Diagnosis Date Noted  . Major depressive disorder, recurrent episode (HCC) 05/26/2015  . Suicide attempt by drug ingestion (HCC)   . ADD (attention deficit disorder) 02/20/2011  . Asperger's disorder 02/20/2011  . ODD (oppositional defiant disorder) 02/20/2011    Patient Centered Plan: Patient is on the  following Treatment Plan(s): Treatment plan to be formulated at next session  Recommendations for Services/Supports/Treatments: Recommendations for Services/Supports/Treatments Recommendations For Services/Supports/Treatments: Individual Therapy, Medication Management  Treatment Plan Summary:    Referrals to Alternative Service(s): Referred to Alternative Service(s):   Place:   Date:   Time:    Referred to Alternative Service(s):   Place:   Date:   Time:    Referred to Alternative Service(s):   Place:   Date:   Time:    Referred to Alternative Service(s):   Place:   Date:   Time:     Carlyne Keehan A

## 2015-12-23 ENCOUNTER — Ambulatory Visit: Payer: BLUE CROSS/BLUE SHIELD | Admitting: Medical

## 2015-12-24 ENCOUNTER — Encounter: Payer: Self-pay | Admitting: Medical

## 2015-12-24 ENCOUNTER — Ambulatory Visit (INDEPENDENT_AMBULATORY_CARE_PROVIDER_SITE_OTHER): Payer: BLUE CROSS/BLUE SHIELD | Admitting: Medical

## 2015-12-24 VITALS — BP 118/70 | Temp 98.1°F | Ht 65.0 in | Wt 144.4 lb

## 2015-12-24 DIAGNOSIS — M79644 Pain in right finger(s): Secondary | ICD-10-CM | POA: Diagnosis not present

## 2015-12-24 DIAGNOSIS — L600 Ingrowing nail: Secondary | ICD-10-CM

## 2015-12-24 DIAGNOSIS — B999 Unspecified infectious disease: Secondary | ICD-10-CM

## 2015-12-24 MED ORDER — DOXYCYCLINE HYCLATE 100 MG PO TABS: 100.0000 mg | ORAL_TABLET | Freq: Two times a day (BID) | ORAL | 0 refills | Status: DC

## 2015-12-24 NOTE — Patient Instructions (Addendum)
You appear to have ingrown toenail with mild infection presently. I will rx doxycycline antibiotic for the infection. Warm salt water soaks twice daily. Advised not to cut at nail or dig at nail presently.  If area not improved by Monday then let us know and will refer you to podiatrist to get you scheduled sometime next week for total or partial toenail excision.   For your rt 3rd digit pain will refer you to hand specialist.    Follow up 7 days or as needed

## 2015-12-24 NOTE — Progress Notes (Signed)
Subjective:    Patient ID: Terry Hutchinson, male    DOB: December 04, 1995, 20 y.o.   MRN: [id number]  HPI  Pt in in with left great toe pain for one week. No fall or trauma. No stubbing of the toe. Pt has lateral aspect of toe swollen. About 5 days ago he squeezed toe and got discharge. No fever, no chills or sweats.   Pt reports 3rd digit still has pain mid joint. Now over a month. Pt seen on Oct 6th. Xray was negative. Pt was given a splint. Pt states he never got better. He was given splint. Used for 3 weeks.  Mechanism of injury hit metal hinge on seat.(report today)    Review of Systems  Constitutional: Negative for chills, fatigue and fever.  Respiratory: Negative for cough, chest tightness, shortness of breath and wheezing.   Cardiovascular: Negative for chest pain and palpitations.  Musculoskeletal:       Lt great toe pain.  3rd digit pain.  Skin:       Left great lateral aspect is swollen and tender.   Hematological: Negative for adenopathy. Does not bruise/bleed easily.  Psychiatric/Behavioral: Negative for behavioral problems, confusion, sleep disturbance and suicidal ideas.    Past Medical History:  Diagnosis Date  . ADHD (attention deficit hyperactivity disorder)   . Anemia   . Asperger's disorder   . Depression   . History of chicken pox   . Migraine   . Oppositional defiant disorder      Social History   Social History  . Marital status: Single    Spouse name: N/A  . Number of children: N/A  . Years of education: N/A   Occupational History  . Not on file.   Social History Main Topics  . Smoking status: Never Smoker  . Smokeless tobacco: Not on file  . Alcohol use No  . Drug use: No  . Sexual activity: No   Other Topics Concern  . Not on file   Social History Narrative  . No narrative on file    Past Surgical History:  Procedure Laterality Date  . TONSILLECTOMY    . tubes in ears     in the past  . WISDOM TOOTH EXTRACTION      Family  History  Problem Relation Age of Onset  . Depression Mother   . ADD / ADHD Father   . Alcohol abuse Maternal Grandfather   . Depression Maternal Grandmother     Allergies  Allergen Reactions  . Peach [Prunus Persica] Swelling    Peach Fuzz  . Vantin Rash    Current Outpatient Prescriptions on File Prior to Visit  Medication Sig Dispense Refill  . amphetamine-dextroamphetamine (ADDERALL XR) 25 MG 24 hr capsule Take 2 capsules by mouth every morning. For ADHD 60 capsule 0  . hydrOXYzine (ATARAX/VISTARIL) 25 MG tablet Take 1 tablet (25 mg total) by mouth at bedtime. 30 tablet 1  . OLANZapine (ZYPREXA) 10 MG tablet Take 1 tablet (10 mg total) by mouth at bedtime. For mood control 30 tablet 1   No current facility-administered medications on file prior to visit.     BP 118/70 (BP Location: Right Arm, Patient Position: Sitting)   Temp 98.1 F (36.7 C) (Oral)   Ht 5\' 5"  (1.651 m)   Wt 144 lb 6.4 oz (65.5 kg)   BMI 24.03 kg/m       Objective:   Physical Exam  General- no acute distress.  Left foot- normal exam  except left great toe lateral aspect looks swollen. Mild pink appearance. Mild tender not warm. No discharge presently.   Rt hand- normal exam but on flexion pain 3rd digit mid aspect over pip joint. The joint looks mild swollen. But not warm.No tenderness to palpation      Assessment & Plan:  You appear to have ingrown toenail with mild infection presently. I will rx doxycycline antibiotic for the infection. Warm salt water soaks twice daily. Advised not to cut at nail or dig at nail presently.  If area not improved by Monday then let us know and will refer you to podiatrist to get you scheduled sometime next week for total or partial toenail excision.   For your rt 3rd digit pain will refer you to hand specialist.  Follow up in 7 days or as needed  Talesha Ellithorpe, Ramon DredgeEdward, VF CorporationPA-C

## 2015-12-28 ENCOUNTER — Ambulatory Visit (INDEPENDENT_AMBULATORY_CARE_PROVIDER_SITE_OTHER): Payer: BLUE CROSS/BLUE SHIELD | Admitting: Podiatry

## 2015-12-28 ENCOUNTER — Encounter: Payer: Self-pay | Admitting: Podiatry

## 2015-12-28 DIAGNOSIS — L600 Ingrowing nail: Secondary | ICD-10-CM | POA: Diagnosis not present

## 2015-12-28 NOTE — Progress Notes (Signed)
   Subjective:    Patient ID: Terry Hutchinson, male    DOB: [birth year], 20 y.o.   MRN: [id number]  HPI  20 year old male presents the office today for concerns of ingrown toenail to the left big toe which is round and painful and the area has states that he is not having any drainage or redness from the area but he did see his primary care physician and he was started on doxycycline. He does state that since starting antibiotics the pain has improved although discontinue. He has no other complaints.  Review of Systems  All other systems reviewed and are negative.      Objective:   Physical Exam General: AAO x3, NAD; appears to be anxious  Dermatological: There is incurvation of the left hallux nail border with localized edema and erythema and evidence of incurvation. There is tenderness palpation. There is no erythema proximal there is no ascending cellulitis. No drainage or pus expressed. No open lesions or pre-ulcerative lesions.  Vascular: Dorsalis Pedis artery and Posterior Tibial artery pedal pulses are 2/4 bilateral with immedate capillary fill time. There is no pain with calf compression, swelling, warmth, erythema.   Neruologic: Grossly intact via light touch bilateral. Vibratory intact via tuning fork bilateral. Protective threshold with Semmes Wienstein monofilament intact to all pedal sites bilateral.   Musculoskeletal: No gross boney pedal deformities bilateral. No pain, crepitus, or limitation noted with foot and ankle range of motion bilateral. Muscular strength 5/5 in all groups tested bilateral.  Gait: Unassisted, Nonantalgic.      Assessment & Plan:  Ingrown toenail left hallux toenail  -Treatment options discussed including all alternatives, risks, and complications -Etiology of symptoms were discussed -S and recommended a partial nail avulsion to the area. However upon discussion of the procedure the patient he became very anxious and wishes not to proceed with this. I  did debride the symptomatic portion ingrown toenail. Recommended Epson salt soaks daily and continue antibiotics. I'll see him back in 1 week and if the symptoms are not improving we will proceed with a partial nail avulsion he agrees to this.  Ovid CurdMatthew Wagoner, DPM

## 2015-12-28 NOTE — Patient Instructions (Signed)

## 2016-01-03 ENCOUNTER — Telehealth (HOSPITAL_COMMUNITY): Payer: Self-pay

## 2016-01-03 NOTE — Telephone Encounter (Signed)
Patient needs a refill on Adderall, last visit was 9/21 and he has a follow up on 11/21 - patient is out of medication as of today. Please review and advise, thank you

## 2016-01-04 ENCOUNTER — Encounter: Payer: Self-pay | Admitting: Podiatry

## 2016-01-04 ENCOUNTER — Ambulatory Visit (INDEPENDENT_AMBULATORY_CARE_PROVIDER_SITE_OTHER): Payer: BLUE CROSS/BLUE SHIELD | Admitting: Podiatry

## 2016-01-04 ENCOUNTER — Other Ambulatory Visit (HOSPITAL_COMMUNITY): Payer: Self-pay | Admitting: Psychiatry

## 2016-01-04 DIAGNOSIS — F900 Attention-deficit hyperactivity disorder, predominantly inattentive type: Secondary | ICD-10-CM

## 2016-01-04 DIAGNOSIS — L600 Ingrowing nail: Secondary | ICD-10-CM | POA: Diagnosis not present

## 2016-01-04 DIAGNOSIS — F3342 Major depressive disorder, recurrent, in full remission: Secondary | ICD-10-CM

## 2016-01-04 MED ORDER — AMPHETAMINE-DEXTROAMPHET ER 25 MG PO CP24: 50.0000 mg | ORAL_CAPSULE | ORAL | 0 refills | Status: DC

## 2016-01-04 MED ORDER — OLANZAPINE 10 MG PO TABS: 10.0000 mg | ORAL_TABLET | Freq: Every day | ORAL | 0 refills | Status: DC

## 2016-01-04 NOTE — Telephone Encounter (Signed)
Prescription printed by Dr. Lolly MustacheArfeen, patients mother called to come and pick up

## 2016-01-05 ENCOUNTER — Telehealth (HOSPITAL_COMMUNITY): Payer: Self-pay | Admitting: Psychiatry

## 2016-01-05 NOTE — Telephone Encounter (Signed)
01/05/16 2:31pm Patient left driver's license at home - pt pick-up rx script - patient advised that next time he will need his license /sh

## 2016-01-06 ENCOUNTER — Ambulatory Visit (HOSPITAL_COMMUNITY): Payer: Self-pay | Admitting: Clinical

## 2016-01-06 NOTE — Progress Notes (Signed)
Subjective: 20 year old male presents the also for follow-up evaluation of ingrown big toe. He states he is doing much better. He feels that he did see some pus coming from the nail blows unsure. He has continued antibiotic. His been soaking in Epson salts. The tenderness is greatly improved. Denies any systemic complaints such as fevers, chills, nausea, vomiting. No acute changes since last appointment, and no other complaints at this time.   Objective: AAO x3, NAD DP/PT pulses palpable bilaterally, CRT less than 3 seconds Along the left nail border there is decreased edema erythema although does remain somewhat. There is no drainage or pus expressed today. There is no tenderness palpation to the area. There is no ascending cellulitis. There is no malodor.  No open lesions or pre-ulcerative lesions.  No pain with calf compression, swelling, warmth, erythema  Assessment: Ingrown toenail left hallux  Plan: -All treatment options discussed with the patient including all alternatives, risks, complications.  -I again recommended a partial nail avulsion however he declined this today. I did debride more the nail today. Continue Epson salt soaks, Neosporin and a Band-Aid. Does not resolve the next week or 2 we will will proceed with the procedure and he agrees to this. -Patient encouraged to call the office with any questions, concerns, change in symptoms.   Ovid CurdMatthew Javel Hersh, DPM

## 2016-01-11 ENCOUNTER — Ambulatory Visit (HOSPITAL_COMMUNITY): Payer: Self-pay | Admitting: Psychiatry

## 2016-01-12 ENCOUNTER — Ambulatory Visit (HOSPITAL_COMMUNITY): Payer: Self-pay | Admitting: Psychiatry

## 2016-01-18 ENCOUNTER — Ambulatory Visit (INDEPENDENT_AMBULATORY_CARE_PROVIDER_SITE_OTHER): Payer: BLUE CROSS/BLUE SHIELD | Admitting: Podiatry

## 2016-01-18 ENCOUNTER — Encounter: Payer: Self-pay | Admitting: Podiatry

## 2016-01-18 DIAGNOSIS — L600 Ingrowing nail: Secondary | ICD-10-CM

## 2016-01-18 NOTE — Progress Notes (Signed)
Subjective:  [age] year old male presents the office today for concerns of ingrown tonsil left big toe. He states it is not as swollen or bed but is still very tender to palpation and with shoes. He was to go ahead and proceed with procedure to remove the nail corner.  Denies any systemic complaints such as fevers, chills, nausea, vomiting. No acute changes since last appointment, and no other complaints at this time.   Objective: AAO x3, NAD DP/PT pulses palpable bilaterally, CRT less than 3 seconds There is incurvation along the lateral aspect of the left first toenail. There is tenderness palpation but is localized edema and erythema but there is no drainage or pus and there is no ascending cellulitis. There is no incurvation or pain to the medial nail border. No pathology other toenails.  No open lesions or pre-ulcerative lesions.  No pain with calf compression, swelling, warmth, erythema  Assessment: 89 year oldmale left lateral nail border symptomatic ingrown toenail   Plan: -All treatment options discussed with the patient including all alternatives, risks, complications.  -At this time, the patient is requesting partial nail removal with chemical matricectomy to the symptomatic portion of the nail. Risks and complications were discussed with the patient for which they understand and  verbally consent to the procedure. Under sterile conditions a total of 3 mL of a mixture of 2% lidocaine plain and 0.5% Marcaine plain was infiltrated in a hallux block fashion. Once anesthetized, the skin was prepped in sterile fashion. A tourniquet was then applied. Next the lateral aspect of hallux nail border was then sharply excised making sure to remove the entire offending nail border. There were multiple pieces of nail present in the nail border. Once the nails were ensured to be removed area was debrided and the underlying skin was intact. There is no purulence identified in the procedure. Next phenol was  then applied under standard conditions and copiously irrigated. Silvadene was applied. A dry sterile dressing was applied. After application of the dressing the tourniquet was removed and there is found to be an immediate capillary refill time to the digit. The patient tolerated the procedure well any complications. Post procedure instructions were discussed the patient for which he verbally understood. Follow-up in one week for nail check or sooner if any problems are to arise. Discussed signs/symptoms of infection and directed to call the office immediately should any occur or go directly to the emergency room. In the meantime, encouraged to call the office with any questions, concerns, changes symptoms. -Patient encouraged to call the office with any questions, concerns, change in symptoms.  Terry CurdMatthew Oliana Hutchinson, DPM

## 2016-01-18 NOTE — Patient Instructions (Signed)

## 2016-01-19 ENCOUNTER — Telehealth (HOSPITAL_COMMUNITY): Payer: Self-pay

## 2016-01-19 ENCOUNTER — Telehealth: Payer: Self-pay | Admitting: *Deleted

## 2016-01-19 ENCOUNTER — Encounter: Payer: Self-pay | Admitting: Podiatry

## 2016-01-19 NOTE — Telephone Encounter (Addendum)
-----   Message from Vivi BarrackMatthew R Wagoner, DPM sent at 01/19/2016  3:33 PM EST ----- Patient called the [place] office and asking when he can wear a shoe. He had a partial nail avulsion done yesterday. He can wear a shoe when comfortable. Continue to soak in epsom salts and cover with antibiotic ointment and bandage and follow-up in 1 week. Please let him know. Thank you. Emailed Dr. Gabriel RungWagoner's instructions to pt with request for information needed for his work note.

## 2016-01-19 NOTE — Telephone Encounter (Signed)
Medication management - Called patient back to verify he is coming in on 01/20/16 due to message he is more depressed with some + Suicidal Ideations.  Patient denies any currently with no plan or intent.  States plan to stay around family this evening [facility] return and he starts wanting to act on past thoughts.  Patient stated he is getting ready to take a shower and will be in on 01/20/16 to address problems with medications and increased depressive symptoms.

## 2016-01-20 ENCOUNTER — Ambulatory Visit (INDEPENDENT_AMBULATORY_CARE_PROVIDER_SITE_OTHER): Payer: BLUE CROSS/BLUE SHIELD | Admitting: Psychiatry

## 2016-01-20 ENCOUNTER — Encounter (HOSPITAL_COMMUNITY): Payer: Self-pay | Admitting: Psychiatry

## 2016-01-20 ENCOUNTER — Ambulatory Visit (HOSPITAL_COMMUNITY): Payer: Self-pay | Admitting: Psychiatry

## 2016-01-20 VITALS — BP 112/68 | HR 83 | Ht 65.0 in | Wt 142.0 lb

## 2016-01-20 DIAGNOSIS — Z79899 Other long term (current) drug therapy: Secondary | ICD-10-CM | POA: Diagnosis not present

## 2016-01-20 DIAGNOSIS — F3342 Major depressive disorder, recurrent, in full remission: Secondary | ICD-10-CM

## 2016-01-20 DIAGNOSIS — F900 Attention-deficit hyperactivity disorder, predominantly inattentive type: Secondary | ICD-10-CM

## 2016-01-20 MED ORDER — HYDROXYZINE HCL 25 MG PO TABS
25.0000 mg | ORAL_TABLET | Freq: Every day | ORAL | 1 refills | Status: DC
Start: 2016-01-20 — End: 2016-03-31

## 2016-01-20 MED ORDER — AMPHETAMINE-DEXTROAMPHET ER 25 MG PO CP24: 50.0000 mg | ORAL_CAPSULE | ORAL | 0 refills | Status: DC

## 2016-01-20 MED ORDER — OLANZAPINE-FLUOXETINE HCL 12-25 MG PO CAPS: 1.0000 | ORAL_CAPSULE | Freq: Every evening | ORAL | 1 refills | Status: DC

## 2016-01-20 NOTE — Progress Notes (Signed)
[facility] [id number] Progress Note  Terry HookerMicah Hutchinson [id number] 20 y.o.  01/20/2016 9:13 AM  Chief Complaint:  I'm not sleeping well.  I'm feeling depressed and sad.  I'm concerned my grades.   History of Present Illness:  Terry Hutchinson came for his follow-up appointment.  He admitted the past few days he's been feeling more depressed sad and at times having fleeting and passive suicidal thoughts but no plan or any intent.  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  He sleeping on and off.  Recently his Adderall was switched because his insurance does not cover immediate release and he was giving extended release.  He does not want to go back to immediate release because he cannot afford it.  He mentioned he was very nervous at Thanksgiving because there were too many peoples.  Patient recently seen a podiatrist for ingrowing foot nail.  He was given antibiotic which he recently finished.  Patient still has pain in his foot.   When I ask what triggered the suicidal thoughts he did not specify any triggers and mention is combination of everything.  However these thoughts were not all the time and he was able to distract himself by playing games .  His attention and concentration is fair.  He has no tremors or shakes.  He denies any paranoia, hallucination or any anger issues.  He is no longer in contact with the girl who he sent pornographic pictures.  Patient usually comes with his father but today he came by himself.  He admitted not seeing Frankie due to conflict I'll schedule but like to resume his counseling.  Patient remains labile and minimally cooperative in the session.  He is no longer taking trazodone since we started hydroxyzine at bedtime.  He is taking hydroxyzine but like to try something to help his depression.  He admitted that he is taking the medication as prescribed but in the past he  has a history of questionable compliance.  His energy level is good.  He denies drinking alcohol or using any illegal substances.  He denies any feeling of hopelessness or worthlessness.  His vital signs are stable.  Patient lives with his biological mother and adopted father.   Suicidal Ideation: Yesterday he had a passive and fleeting suicidal thoughts but no plan, intent. Plan Formed: No Patient has means to carry out plan: No  Homicidal Ideation: No Plan Formed: No Patient has means to carry out plan: No  Medical History; Patient has no active medical problems.  Family History; Patient denies any family history of psychiatric illness.  Substance Abuse History; Patient denies any history of drug use.  Past Psychiatric History/Hospitalization(s) [facility]  He had tried Lexapro in the past which was discontinued when he was in the hospital.  Patient has history of taking overdose on Naprosyn.  He denies any delusion, psychosis or any hallucination.  Patient has been seen in this office for many years.  He has seen Dr. Ladona Ridgelaylor, Dr. Lucianne MussKumar and recently Dr. Jarold Mottoedapali. Anxiety: No Bipolar Disorder: No Depression: Yes Mania: No Psychosis: No Schizophrenia: No Personality Disorder: No Hospitalization for psychiatric illness: Yes History of Electroconvulsive Shock Therapy: No Prior Suicide Attempts: Yes   Review of Systems: Psychiatric: Agitation: No Hallucination: No Depressed Mood: Yes Insomnia: Yes Hypersomnia: No Altered Concentration:  No Feels Worthless: No Grandiose Ideas: No Belief In Special Powers: No New/Increased Substance Abuse: No Compulsions: No  Neurologic: Headache: No Seizure: No Paresthesias: No  Outpatient Encounter Prescriptions as of 01/20/2016  Medication Sig  . amphetamine-dextroamphetamine (ADDERALL XR) 25 MG 24 hr capsule Take 2 capsules by mouth every morning. For ADHD  . hydrOXYzine  (ATARAX/VISTARIL) 25 MG tablet Take 1 tablet (25 mg total) by mouth at bedtime.  . [DISCONTINUED] amphetamine-dextroamphetamine (ADDERALL XR) 25 MG 24 hr capsule Take 2 capsules by mouth every morning. For ADHD  . [DISCONTINUED] hydrOXYzine (ATARAX/VISTARIL) 25 MG tablet Take 1 tablet (25 mg total) by mouth at bedtime.  . [DISCONTINUED] OLANZapine (ZYPREXA) 10 MG tablet Take 1 tablet (10 mg total) by mouth at bedtime. For mood control  . OLANZapine-FLUoxetine (SYMBYAX) 12-25 MG capsule Take 1 capsule by mouth every evening.  . [DISCONTINUED] doxycycline (VIBRA-TABS) 100 MG tablet Take 1 tablet (100 mg total) by mouth 2 (two) times daily. (Patient not taking: Reported on 01/20/2016)   No facility-administered encounter medications on file as of 01/20/2016.     No results found for this or any previous visit (from the past 2160 hour(s)).  Physical Exam: Consitutional ;  BP 112/68 (BP Location: Right Arm, Patient Position: Sitting, Cuff Size: Normal)   Pulse 83   Ht 5\' 5"  (1.651 m)   Wt 142 lb (64.4 kg)   BMI 23.63 kg/m   Musculoskeletal: Strength & Muscle Tone: within normal limits Gait & Station: normal Patient leans: N/A   Review of Systems  Constitutional: Negative.   HENT: Negative.   Eyes: Negative.   Respiratory: Negative.   Cardiovascular: Negative.  Negative for chest pain and palpitations.  Gastrointestinal: Negative.   Musculoskeletal: Negative for myalgias and neck pain.       Foot pain   Skin: Negative.   Neurological: Negative.    Psychiatric Specialty Exam: General Appearance: Casual and Fairly Groomed  Eye Contact::  Fair  Speech:  Slow  Volume:  Decreased  Mood:  Depressed  Affect:  Appropriate  Thought Process:  Linear  Orientation:  Full (Time, Place, and Person)  Thought Content:  WDL and Logical  Suicidal Thoughts:  Patient mentioned yesterday have passive and fleeting suicidal thoughts but he had no plan or intent.  He was able to distract himself  by playing video games.  Homicidal Thoughts:  No  Memory:  Immediate;   Fair Recent;   Fair Remote;   Fair  Judgement:  Good  Insight:  Fair  Psychomotor Activity:  Decreased  Concentration:  Fair  Recall:  F[employer]Fair  Fund of Knowledge:  Fair  Language:  Fair  Akathisia:  No  Handed:  Right  AIMS (if indicated):     Assets:  Communication Skills Desire for Improvement Housing Physical Health Social Support  ADL's:  Intact  Cognition:  WNL  Sleep:        Review of Psycho-Social Stressors (1), Review and summation of old records (2), Established Problem, Worsening (2), Review of Last Therapy Session (1), Review of Medication Regimen & Side Effects (2) and Review of New Medication or Change in Dosage (2)  Assessment: Axis I; ADHD combined type, autism Spectrum disorder, oppositional defiant disorder, major depressive disorder, recurrent moderate  Axis III:  Past Medical History:  Diagnosis Date  . ADHD (attention deficit hyperactivity disorder)   . Anemia   . Asperger's disorder   . Depression   . History of chicken pox   . Migraine   .  Oppositional defiant disorder     Plan:  I review his chart.  Patient is not taking Adderall extended release because he cannot afford immediate release.  Though he do not see change in the medication causing any worsening of depression and he does not want to go back on immediate release due to insurance coverage.  I recommended to discontinue olanzapine and we will try Symbyax 12/12.5 mg.  Continue Adderall extended release 25 mg 2 tablet daily , Continue Vistaril as needed for anxiety and insomnia.  At this time patient does not have any side effects including any chest pain, insomnia or tremors related to stimulant.  Discussed stimulant abuse, tolerance and withdrawal.  Patient used to take trazodone in the past which was discontinued and started Vistaril.  I encouraged him to restart counseling with Tomma Lightning for his psychosocial issues.   Discussed medication side effects and benefits.  Recommended to call us back if there is any question, concern or worsening of the symptoms.  Discuss safety plan that anytime having active suicidal thoughts or homicidal thoughts and she need to call 911 or go to the local emergency room.  Follow-up in 6 weeks.  Champayne Kocian T., MD 01/20/2016

## 2016-01-25 ENCOUNTER — Ambulatory Visit (INDEPENDENT_AMBULATORY_CARE_PROVIDER_SITE_OTHER): Payer: BLUE CROSS/BLUE SHIELD | Admitting: Podiatry

## 2016-01-25 ENCOUNTER — Encounter: Payer: Self-pay | Admitting: Podiatry

## 2016-01-25 ENCOUNTER — Ambulatory Visit (INDEPENDENT_AMBULATORY_CARE_PROVIDER_SITE_OTHER): Payer: BLUE CROSS/BLUE SHIELD | Admitting: Clinical

## 2016-01-25 DIAGNOSIS — F988 Other specified behavioral and emotional disorders with onset usually occurring in childhood and adolescence: Secondary | ICD-10-CM

## 2016-01-25 DIAGNOSIS — F331 Major depressive disorder, recurrent, moderate: Secondary | ICD-10-CM

## 2016-01-25 DIAGNOSIS — F840 Autistic disorder: Secondary | ICD-10-CM

## 2016-01-25 DIAGNOSIS — Z9889 Other specified postprocedural states: Secondary | ICD-10-CM

## 2016-01-25 DIAGNOSIS — L600 Ingrowing nail: Secondary | ICD-10-CM

## 2016-01-25 NOTE — Patient Instructions (Signed)

## 2016-01-25 NOTE — Progress Notes (Signed)
Subjective: Terry Hutchinson is a 20 y.o.  male returns to office today for follow up evaluation after having left Hallux Lateral nail avulsion performed. Patient has been soaking using epsom salts  and applying topical antibiotic covered with bandaid daily. Patient denies fevers, chills, nausea, vomiting. Denies any calf pain, chest pain, SOB.   Objective:  Vitals: Reviewed  General: Well developed, nourished, in no acute distress, alert and oriented x3   Dermatology: Skin is warm, dry and supple bilateral. Left hallux nail border appears to be clean, dry, with mild granular tissue and surrounding scab. There is no surrounding erythema, edema, drainage/purulence. The remaining nails appear unremarkable at this time. There are no other lesions or other signs of infection present.  Neurovascular status: Intact. No lower extremity swelling; No pain with calf compression bilateral.  Musculoskeletal: Decreased tenderness to palpation of the lateral hallux nail fold. Muscular strength within normal limits bilateral.   Assesement and Plan: S/p partial nail avulsion, doing well.   -Continue soaking in epsom salts twice a day followed by antibiotic ointment and a band-aid. Can leave uncovered at night. Continue this until completely healed.  -If the area has not healed in 2 weeks, call the office for follow-up appointment, or sooner if any problems arise.  -Monitor for any signs/symptoms of infection. Call the office immediately if any occur or go directly to the emergency room. Call with any questions/concerns.  Ovid CurdMatthew Justin Buechner, DPM

## 2016-01-28 ENCOUNTER — Ambulatory Visit (HOSPITAL_COMMUNITY): Payer: Self-pay | Admitting: Psychiatry

## 2016-01-31 ENCOUNTER — Encounter (HOSPITAL_COMMUNITY): Payer: Self-pay | Admitting: Clinical

## 2016-01-31 NOTE — Progress Notes (Signed)
   THERAPIST PROGRESS NOTE  Session Time: 4:38 - 5:35  Participation Level: Active  Behavioral Response: CasualAlertAnxious and Depressed  Type of Therapy: Individual Therapy  Treatment Goals addressed: improve psychiatric symptoms, elevate mood, improve unhelpful thought patterns,  healthy coping skills  Interventions: CBT, Motivational Interviewing,   Summary: Terry Hutchinson is a 20  y.o. male who presents with major depressive disorder, recurrent, moderate, and Autism spectrum disorder, and attention deficit disorder, unspecified hyperactivity presence   Suicidal/Homicidal: No -without intent/plan  Therapist Response: [name] met with clinician for an individual session. He discussed his psychiatric symptoms, his current life events and his goals for therapy. [name] shared that he has been feeling very anxious and depressed. He shared that he has an upcoming court date due to having a computer relationship when he was 7 with an individual who was 16. Clinician asked open ended questions and [name] shared his thoughts and emotions. He shared he had no intentions of harming her and is very concerned about what this might mean for his future. [name] denied any suicidal or homicidal ideation. Clinician asked open ended questions about [name]'s admission to the inpatient unit in April 2017. He shared that he had been very upset because of someone lying about him in his online game community. Clinician introduced some basic cbt concepts. Client and clinician discussed the thought emotion connection. Client and clinician discussed how he feels when he believes his negative thoughts. Client and clinician began a discussion about how to challenge and change negative thoughts. Clinician gave [name] a depression homework packet which he agreed to complete and bring back with him next session.   Plan: Return again in 1-2 weeks.  Diagnosis: Axis I major depressive disorder, recurrent, moderate, and Autism  spectrum disorder, and attention deficit disorder, unspecified hyperactivity presence     Ryann Leavitt A, LCSW 01/31/2016

## 2016-02-29 ENCOUNTER — Ambulatory Visit (INDEPENDENT_AMBULATORY_CARE_PROVIDER_SITE_OTHER): Payer: BLUE CROSS/BLUE SHIELD | Admitting: Clinical

## 2016-02-29 DIAGNOSIS — F331 Major depressive disorder, recurrent, moderate: Secondary | ICD-10-CM

## 2016-02-29 DIAGNOSIS — F988 Other specified behavioral and emotional disorders with onset usually occurring in childhood and adolescence: Secondary | ICD-10-CM | POA: Diagnosis not present

## 2016-02-29 DIAGNOSIS — F840 Autistic disorder: Secondary | ICD-10-CM | POA: Diagnosis not present

## 2016-02-29 NOTE — Progress Notes (Signed)
   THERAPIST PROGRESS NOTE  Session Time: 3:33 - 4:05  Participation Level: Active  Behavioral Response: CasualAlertNA  Type of Therapy: Individual Therapy  Treatment Goals addressed: improve psychiatric symptoms, elevate mood, improve unhelpful thought patterns, healthy coping skills  Interventions:  Motivational Interviewing, psychoeducation  Summary: Brandley Aldrete is a 21  y.o. male who presents with major depressive disorder, recurrent, moderate, and Autism spectrum disorder, and attention deficit disorder, unspecified hyperactivity presence   Suicidal/Homicidal: No -without intent/plan  Therapist Response: Ion met with clinician for an individual session. He discussed his psychiatric symptoms, his current life events. Xzavior shared that he has been very concerned about an upcoming court date. He shared details about the events that led to him having a court date. He shared his concerns bout what could happen if he is convicted. Clinician asked open ended questions about the charges, which Truth answered openly. He shared his thoughts and emotions. Clinician asked open ended questions about the other person involved. He shared concern for them and stated he did not have hard feelings toward anybody other than himself. Clinician asked open ended questions about his depression and coping skills. He shared he is isolating and waiting until the court date is over. Client and clinician discussed healthy coping skills. Clinician gave Mohamud a homework packet on depression which he agreed to complete before next session.  Plan: Return again in 1-2 weeks.  Diagnosis: Axis I major depressive disorder, recurrent, moderate, and Autism spectrum disorder, and attention deficit disorder, unspecified hyperactivity presence     Amad Mau A, LCSW 02/29/2016

## 2016-03-05 ENCOUNTER — Encounter (HOSPITAL_COMMUNITY): Payer: Self-pay | Admitting: Clinical

## 2016-03-07 ENCOUNTER — Ambulatory Visit (HOSPITAL_COMMUNITY): Payer: Self-pay | Admitting: Clinical

## 2016-03-08 ENCOUNTER — Ambulatory Visit (HOSPITAL_COMMUNITY): Payer: Self-pay | Admitting: Psychiatry

## 2016-03-14 ENCOUNTER — Ambulatory Visit (INDEPENDENT_AMBULATORY_CARE_PROVIDER_SITE_OTHER): Payer: BLUE CROSS/BLUE SHIELD | Admitting: Clinical

## 2016-03-14 ENCOUNTER — Encounter (HOSPITAL_COMMUNITY): Payer: Self-pay | Admitting: Clinical

## 2016-03-14 DIAGNOSIS — F840 Autistic disorder: Secondary | ICD-10-CM | POA: Diagnosis not present

## 2016-03-14 DIAGNOSIS — F331 Major depressive disorder, recurrent, moderate: Secondary | ICD-10-CM | POA: Diagnosis not present

## 2016-03-14 DIAGNOSIS — F988 Other specified behavioral and emotional disorders with onset usually occurring in childhood and adolescence: Secondary | ICD-10-CM

## 2016-03-14 NOTE — Progress Notes (Signed)
   THERAPIST PROGRESS NOTE  Session Time: 4:36 -5:30  Participation Level: Active  Behavioral Response: CasualAlertDepressed  Type of Therapy: Individual Therapy  Treatment Goals addressed: improve psychiatric symptoms, elevate mood,  learn about diagnosis, healthy coping skills  Interventions: CBT, Motivational Interviewing, psychoeducation,   Summary: Isaac Dubie is a 21  y.o. male who presents with major depressive disorder, recurrent, moderate, and Autism spectrum disorder, and attention deficit disorder, unspecified hyperactivity presence   Suicidal/Homicidal: No -without intent/plan  Therapist Response: Whitten met with clinician for an individual session. He discussed his psychiatric symptoms, his current life events and his homework. Norm shared that he has been feeling indifferent. He shared that he went to court and the date was continued. He is of the understanding that this will happen several times. Clinician asked open ended questions about his experience, thoughts and emotions. He shared that he has a sense of fear about the possibilities and acceptance that he will have to deal with whatever outcome happens. Clinician asked if he had any resentment or anger over the situation. He shared that he did not. He shared that he hoped that the other person is okay and getting what ever help they need. Jorgen shared that he did not do his homework, that he misplaced it. Clinician provided a duplicate and client and clinician reviewed it. Jeff and clinician discussed the symptoms of depression. Client and clinician discussed the thought emotion connection(CBT). Client and clinician discussed how Haston spends his time. His face lights up when he discusses playing an on line multi player game. Clinician asked open ended questions and Bertil shared that he thinks the repetitiveness is interesting to him because of his Autism. Hendry agreed to complete packet 3 of depression before next  session.  Plan: Return again in 1-2 weeks.  Diagnosis: Axis I major depressive disorder, recurrent, moderate, and Autism spectrum disorder, and attention deficit disorder, unspecified hyperactivity presence    Ashanti Littles A, LCSW 03/14/2016

## 2016-03-17 DIAGNOSIS — F329 Major depressive disorder, single episode, unspecified: Secondary | ICD-10-CM | POA: Insufficient documentation

## 2016-03-17 DIAGNOSIS — T1491XA Suicide attempt, initial encounter: Secondary | ICD-10-CM | POA: Insufficient documentation

## 2016-03-17 DIAGNOSIS — F32A Depression, unspecified: Secondary | ICD-10-CM | POA: Insufficient documentation

## 2016-03-23 ENCOUNTER — Telehealth (HOSPITAL_COMMUNITY): Payer: Self-pay

## 2016-03-23 NOTE — Telephone Encounter (Signed)
Patients father is calling, patient was [facility] and he was there a week. They redid all of his medications and took him off of Adderall. Patients father reports he is not doing well without the Adderall and they would like to come back in to be assessed. I made him an appointment for next week, just an [place]

## 2016-03-24 NOTE — Telephone Encounter (Signed)
Need discharge summary

## 2016-03-27 NOTE — Telephone Encounter (Signed)
Called and spoke with patients mother, she is going to fax the discharge summary to me today.

## 2016-03-31 ENCOUNTER — Encounter (HOSPITAL_COMMUNITY): Payer: Self-pay | Admitting: Psychiatry

## 2016-03-31 ENCOUNTER — Encounter (HOSPITAL_COMMUNITY): Payer: Self-pay

## 2016-03-31 ENCOUNTER — Ambulatory Visit (INDEPENDENT_AMBULATORY_CARE_PROVIDER_SITE_OTHER): Payer: BLUE CROSS/BLUE SHIELD | Admitting: Psychiatry

## 2016-03-31 VITALS — BP 112/68 | HR 86 | Ht 65.5 in | Wt 155.2 lb

## 2016-03-31 DIAGNOSIS — Z811 Family history of alcohol abuse and dependence: Secondary | ICD-10-CM | POA: Diagnosis not present

## 2016-03-31 DIAGNOSIS — Z91018 Allergy to other foods: Secondary | ICD-10-CM

## 2016-03-31 DIAGNOSIS — Z888 Allergy status to other drugs, medicaments and biological substances status: Secondary | ICD-10-CM

## 2016-03-31 DIAGNOSIS — Z9889 Other specified postprocedural states: Secondary | ICD-10-CM | POA: Diagnosis not present

## 2016-03-31 DIAGNOSIS — Z818 Family history of other mental and behavioral disorders: Secondary | ICD-10-CM

## 2016-03-31 DIAGNOSIS — F321 Major depressive disorder, single episode, moderate: Secondary | ICD-10-CM

## 2016-03-31 DIAGNOSIS — Z79899 Other long term (current) drug therapy: Secondary | ICD-10-CM

## 2016-03-31 MED ORDER — BUPROPION HCL ER (XL) 150 MG PO TB24: 150.0000 mg | ORAL_TABLET | ORAL | 0 refills | Status: DC

## 2016-03-31 MED ORDER — ILOPERIDONE 4 MG PO TABS: 4.0000 mg | ORAL_TABLET | Freq: Every day | ORAL | 0 refills | Status: DC

## 2016-03-31 NOTE — Progress Notes (Signed)
BH MD/PA/NP OP Progress Note  03/31/2016 12:08 PM Terry HookerMicah Hutchinson  MRN:  [id number]  Chief Complaint:  Subjective:  I was hospitalized because of suicidal thinking.  HPI: Patient came with his mother for his appointment.  He was [facility] after having suicidal threatening behavior.  He told he was about to kill himself with a knife but family called the police.  His mother endorsed that patient has been very defiant, irritable and having severe mood swings.  He is spending more than 10 hours playing video games.  He does not follow rules and regulation.  Patient told he is very stressed about upcoming court date .  Patient has charges for sexual offense and his next court date is on every [id number].  He started seeing Tomma LightningFrankie but he does not feel it is working very well for him.  Patient has Asperger and he has difficulty attention, concentration.  I reviewed [facility].  Patient was given Prozac and his olanzapine was discontinued.  He was given Fanpat 4 mg to take half tablet twice a day.  His Adderall was discontinued.  Patient's mother believe he is not hyper and irritable anymore but is still spending too much time in watching and playing video games.  Patient denies any suicidal thoughts or homicidal thought.  He denies using drugs or alcohol.  His appetite is okay.  Though he is obsessed about his court date denies any crying spells, feeling hopelessness or worthlessness.  His attention concentration remains fair.  Patient was charged when he sent pornographic pictures to a minor.  As per mother he is not sleeping as good.  Patient denies any paranoia, hallucination or any self abusive behavior.    Visit Diagnosis:    ICD-9-CM ICD-10-CM   1. Moderate single current episode of major depressive disorder (HCC) 296.22 F32.1 iloperidone (FANAPT) 4 MG TABS tablet     buPROPion (WELLBUTRIN XL) 150 MG 24 hr tablet    Past Psychiatric  History: Patient has history of oppositional defiant disorder, Asperger, ADD and major depressive disorder.  He has multiple hospitalization.  He was admitted in April 2017 and recently generally [facility].  Patient has history of taking overdose on Naprosyn, in the past he had tried Lexapro .  Patient has seen Dr. Ladona Ridgelaylor, Dr. Lucianne MussKumar and Dr. Jarold Mottoedapali in this office.    Past Medical History:  Past Medical History:  Diagnosis Date  . ADHD (attention deficit hyperactivity disorder)   . Anemia   . Asperger's disorder   . Depression   . History of chicken pox   . Migraine   . Oppositional defiant disorder     Past Surgical History:  Procedure Laterality Date  . TONSILLECTOMY    . tubes in ears     in the past  . WISDOM TOOTH EXTRACTION      Family Psychiatric History: Reviewed.   Family History:  Family History  Problem Relation Age of Onset  . Depression Mother   . ADD / ADHD Father   . Alcohol abuse Maternal Grandfather   . Depression Maternal Grandmother     Social History:  Social History   Social History  . Marital status: Single    Spouse name: N/A  . Number of children: N/A  . Years of education: N/A   Social History Main Topics  . Smoking status: Never Smoker  . Smokeless tobacco: Never Used  . Alcohol  use No  . Drug use: No  . Sexual activity: No   Other Topics Concern  . None   Social History Narrative  . None    Allergies:  Allergies  Allergen Reactions  . Peach [Prunus Persica] Swelling    Peach Fuzz  . Vantin Rash    Metabolic Disorder Labs: Lab Results  Component Value Date   HGBA1C 5.2 05/31/2015   MPG 103 05/31/2015   Lab Results  Component Value Date   PROLACTIN 19.6 (H) 05/30/2015   Lab Results  Component Value Date   CHOL 176 05/31/2015   TRIG 273 (H) 05/31/2015   HDL 47 05/31/2015   CHOLHDL 3.7 05/31/2015   VLDL 55 (H) 05/31/2015   LDLCALC 74 05/31/2015     Current Medications: Current  Outpatient Prescriptions  Medication Sig Dispense Refill  . buPROPion (WELLBUTRIN XL) 150 MG 24 hr tablet Take 1 tablet (150 mg total) by mouth every morning. 30 tablet 0  . iloperidone (FANAPT) 4 MG TABS tablet Take 1 tablet (4 mg total) by mouth at bedtime. 30 tablet 0   No current facility-administered medications for this visit.     Neurologic: Headache: No Seizure: No Paresthesias: No  Musculoskeletal: Strength & Muscle Tone: within normal limits Gait & Station: normal Patient leans: N/A  Psychiatric Specialty Exam: Review of Systems  Constitutional: Negative for weight loss.  HENT: Negative.   Respiratory: Negative.   Cardiovascular: Negative.   Genitourinary: Negative.   Musculoskeletal: Negative.   Skin: Negative.   Neurological: Negative.   Psychiatric/Behavioral: The patient has insomnia.     Blood pressure 112/68, pulse 86, height 5' 5.5" (1.664 m), weight 155 lb 3.2 oz (70.4 kg).Body mass index is 25.43 kg/m.  General Appearance: Casual  Eye Contact:  Fair  Speech:  Slow  Volume:  Normal  Mood:  Depressed  Affect:  Labile  Thought Process:  Goal Directed  Orientation:  Full (Time, Place, and Person)  Thought Content: Logical   Suicidal Thoughts:  No  Homicidal Thoughts:  No  Memory:  Immediate;   Fair  Judgement:  Fair  Insight:  Fair  Psychomotor Activity:  Normal  Concentration:  Concentration: Poor and Attention Span: Fair  Recall:  [employer] of Knowledge: Fair  Language: Fair  Akathisia:  No  Handed:  Right  AIMS (if indicated):  0  Assets:  Communication Skills Desire for Improvement Housing Physical Health Social Support  ADL's:  Intact  Cognition: WNL  Sleep:  fair   Assessment: Major depressive disorder, recurrent moderate.  Oppositional defiant disorder, ADD, Asperger's syndrome spectrum disorder  Plan: I reviewed discharge summary from [facility].  I have a long and lengthy discussion with the patient and his  mother.  Overall patient is feeling somewhat better with Fanpat but he continued to have poor attention, concentration.  Recommended to try Wellbutrin and a stop the Prozac.  We will defer adding stimulant as patient has lot of irritability and behavior problem which can be contributed by stimulant.  I do believe patient require intense counseling and we will try to see Glena Norfolk.  Patient has upcoming court date on February 19.  Discuss in detail medication side effects and efficacy.  Recommended to call us back if he has any question, concern or worsening of the symptom.  Follow-up in 4 weeks.  Discuss safety plan that anytime having active suicidal thoughts, homicidal thoughts then he need to call 911 or go to the local  emergency room.     Shadeed Colberg T., MD 03/31/2016, 12:08 PM

## 2016-04-18 ENCOUNTER — Ambulatory Visit (INDEPENDENT_AMBULATORY_CARE_PROVIDER_SITE_OTHER): Payer: BLUE CROSS/BLUE SHIELD | Admitting: Psychology

## 2016-04-18 DIAGNOSIS — F845 Asperger's syndrome: Secondary | ICD-10-CM | POA: Diagnosis not present

## 2016-04-18 DIAGNOSIS — F902 Attention-deficit hyperactivity disorder, combined type: Secondary | ICD-10-CM | POA: Diagnosis not present

## 2016-04-18 DIAGNOSIS — F330 Major depressive disorder, recurrent, mild: Secondary | ICD-10-CM

## 2016-04-18 NOTE — Progress Notes (Signed)
   THERAPIST PROGRESS NOTE  Session Time: 8:05am-8:40am  Participation Level: Active but guarded  Behavioral Response: Well GroomedAlertaffect WNL  Type of Therapy: Individual Therapy  Treatment Goals addressed: Diagnosis: MDD, Autism Spectrum and ADHD  Interventions: CBT, Supportive and Other: tx planning  Summary: Terry HookerMicah Hutchinson is a 21 y.o. male who presents with affect WNL.  Pt reports that he is sleeping well and not depressed.  Pt reports mood is good.  Pt reports he is coming to new therapist as other's thought would be a good match.  Pt poor historian about his tx hx.  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  Pt reports he enjoys this job.  Pt was guarded not wanting to disclose about his charges- pt eventually reports that he is facing sexual exploitation of minor for online relationship w/ minor.  Pt wasn't sure of next court date.  Pt reports this is his only stressor.  Pt reports interactions w/ family have been good- just grounded from electronics due to charges.  Pt had a hard time identifying his goals for counseling- reviewed tx plan and identified need to be more social.  Pt finished early session as wanted to be ready for work.   Suicidal/Homicidal: Nowithout intent/plan  Therapist Response: Assessed pt current functioning per pt report.  Explored w/pt his tx hx for counseling and medication and reason for change in therapist.  Completed PHq9 for depressive symptoms scored 1.  Reviewed pt tx plan and goals and had pt identify his need and wants for counseling at this time.   Plan: Return again in 2 weeks. Continue w/ dr. Lolly MustacheArfeen as scheduled.   Diagnosis: MDD, Austism Spectrum D/O and ADHD   YATES,LEANNE, LPC 04/18/2016

## 2016-04-25 ENCOUNTER — Encounter (HOSPITAL_COMMUNITY): Payer: Self-pay | Admitting: Psychiatry

## 2016-04-25 ENCOUNTER — Other Ambulatory Visit (HOSPITAL_COMMUNITY): Payer: Self-pay

## 2016-04-25 ENCOUNTER — Ambulatory Visit (INDEPENDENT_AMBULATORY_CARE_PROVIDER_SITE_OTHER): Payer: BLUE CROSS/BLUE SHIELD | Admitting: Psychiatry

## 2016-04-25 VITALS — BP 104/64 | HR 96 | Ht 67.0 in | Wt 159.0 lb

## 2016-04-25 DIAGNOSIS — F331 Major depressive disorder, recurrent, moderate: Secondary | ICD-10-CM | POA: Diagnosis not present

## 2016-04-25 DIAGNOSIS — Z811 Family history of alcohol abuse and dependence: Secondary | ICD-10-CM | POA: Diagnosis not present

## 2016-04-25 DIAGNOSIS — F913 Oppositional defiant disorder: Secondary | ICD-10-CM | POA: Diagnosis not present

## 2016-04-25 DIAGNOSIS — F909 Attention-deficit hyperactivity disorder, unspecified type: Secondary | ICD-10-CM | POA: Diagnosis not present

## 2016-04-25 DIAGNOSIS — Z79899 Other long term (current) drug therapy: Secondary | ICD-10-CM | POA: Diagnosis not present

## 2016-04-25 DIAGNOSIS — Z818 Family history of other mental and behavioral disorders: Secondary | ICD-10-CM

## 2016-04-25 DIAGNOSIS — F845 Asperger's syndrome: Secondary | ICD-10-CM

## 2016-04-25 DIAGNOSIS — Z888 Allergy status to other drugs, medicaments and biological substances status: Secondary | ICD-10-CM | POA: Diagnosis not present

## 2016-04-25 MED ORDER — LAMOTRIGINE 25 MG PO TABS: ORAL_TABLET | ORAL | 1 refills | Status: DC

## 2016-04-25 MED ORDER — ILOPERIDONE 4 MG PO TABS: 4.0000 mg | ORAL_TABLET | Freq: Every day | ORAL | 1 refills | Status: DC

## 2016-04-25 NOTE — Progress Notes (Signed)
BH MD/PA/NP OP Progress Note  04/25/2016 9:06 AM Easton Fetty  MRN:  [id number]  Chief Complaint:  Subjective:  I'm okay  HPI: Rorey came for her follow-up appointment with his mother.  On his last visit we started him on Wellbutrin but mother endorsed that he is poorly compliant with Wellbutrin.  When I ask why patient replied that he forgets to take the medication.  Since he was restricted from technology, mother endorsed that he is doing very well he was able to do his work on time but then technology restriction ended he again disrespectful and defiant.  Mother endorsed that he started cutting again and there were superficial scratches on his both legs.  Patient minimizes these behavior and denies it was a suicidal attempt.  Mother is frustrated because he does not follow rules and regulation but he is going to work without any issues.  His court date is extended for next month.  He was charged for sexual offense.  Patient remains inattentive, superficially cooperative and minimizes the symptoms.  He admitted spending too much time doing video games.  Most of the information was obtained from mother.  The patient mentioned that he sleeping good and denies any hallucination, paranoia.  His appetite is increased and he has gained weight from the past.  He denies any feeling of hopelessness or worthlessness.  He denies any suicidal thoughts.  He has attended 1 session with Adella Hare but he did not talk and did not mention about his cutting behavior.  Visit Diagnosis:    ICD-9-CM ICD-10-CM   1. Moderate episode of recurrent major depressive disorder (HCC) 296.32 F33.1 lamoTRIgine (LAMICTAL) 25 MG tablet     iloperidone (FANAPT) 4 MG TABS tablet  2. Asperger syndrome 299.80 F84.5 lamoTRIgine (LAMICTAL) 25 MG tablet     iloperidone (FANAPT) 4 MG TABS tablet    Past Psychiatric History: Reviewed. Patient has history of oppositional defiant disorder, Asperger, ADD and major depressive disorder.  He has  multiple hospitalization.  He was admitted in April 2017 and in January 2018 at [facility].  Patient has history of taking overdose on Naprosyn, in the past he had tried Lexapro .  Patient has seen Dr. Ladona Ridgel, Dr. Lucianne Muss and Dr. Jarold Motto in this office.   Past Medical History:  Past Medical History:  Diagnosis Date  . ADHD (attention deficit hyperactivity disorder)   . Anemia   . Asperger's disorder   . Depression   . History of chicken pox   . Migraine   . Oppositional defiant disorder     Past Surgical History:  Procedure Laterality Date  . TONSILLECTOMY    . tubes in ears     in the past  . WISDOM TOOTH EXTRACTION      Family Psychiatric History: Reviewed.  Family History:  Family History  Problem Relation Age of Onset  . Depression Mother   . ADD / ADHD Father   . Alcohol abuse Maternal Grandfather   . Depression Maternal Grandmother     Social History:  Social History   Social History  . Marital status: Single    Spouse name: N/A  . Number of children: N/A  . Years of education: N/A   Social History Main Topics  . Smoking status: Never Smoker  . Smokeless tobacco: Never Used  . Alcohol use No  . Drug use: No  . Sexual activity: No   Other Topics Concern  . None   Social History Narrative  .  None    Allergies:  Allergies  Allergen Reactions  . Peach [Prunus Persica] Swelling    Peach Fuzz  . Vantin Rash    Metabolic Disorder Labs: Lab Results  Component Value Date   HGBA1C 5.2 05/31/2015   MPG 103 05/31/2015   Lab Results  Component Value Date   PROLACTIN 19.6 (H) 05/30/2015   Lab Results  Component Value Date   CHOL 176 05/31/2015   TRIG 273 (H) 05/31/2015   HDL 47 05/31/2015   CHOLHDL 3.7 05/31/2015   VLDL 55 (H) 05/31/2015   LDLCALC 74 05/31/2015     Current Medications: Current Outpatient Prescriptions  Medication Sig Dispense Refill  . iloperidone (FANAPT) 4 MG TABS tablet Take 1 tablet (4 mg total) by  mouth at bedtime. 30 tablet 0  . omega-3 fish oil (MAXEPA) 1000 MG CAPS capsule Take 1 capsule by mouth daily.    Marland Kitchen buPROPion (WELLBUTRIN XL) 150 MG 24 hr tablet Take 1 tablet (150 mg total) by mouth every morning. (Patient not taking: Reported on 04/25/2016) 30 tablet 0   No current facility-administered medications for this visit.     Neurologic: Headache: No Seizure: No Paresthesias: No  Musculoskeletal: Strength & Muscle Tone: within normal limits Gait & Station: normal Patient leans: N/A  Psychiatric Specialty Exam: Review of Systems  Constitutional: Positive for weight loss.  HENT: Negative.   Cardiovascular: Negative for chest pain.  Gastrointestinal: Negative for heartburn.  Musculoskeletal: Negative.   Skin: Negative for rash.       Superficially scratches on his leg but no active bleeding  Neurological: Negative.     Blood pressure 104/64, pulse 96, height 5\' 7"  (1.702 m), weight 159 lb (72.1 kg).Body mass index is 24.9 kg/m.  General Appearance: Casual and Superficially cooperative  Eye Contact:  Fair  Speech:  Slow  Volume:  Normal  Mood:  Anxious  Affect:  Labile  Thought Process:  Descriptions of Associations: Intact  Orientation:  Full (Time, Place, and Person)  Thought Content: Logical   Suicidal Thoughts:  No  Homicidal Thoughts:  No  Memory:  Immediate;   Fair Recent;   Fair Remote;   Fair  Judgement:  Fair  Insight:  Fair  Psychomotor Activity:  Normal  Concentration:  Concentration: Fair and Attention Span: Fair  Recall:  [employer] of Knowledge: Fair  Language: Fair  Akathisia:  No  Handed:  Right  AIMS (if indicated):  0  Assets:  Desire for Improvement Housing  ADL's:  Intact  Cognition: WNL  Sleep:  fair    Assessment: Major depressive disorder, recurrent.  Oppositional defiant disorder.  ADD.  Asperger syndrome  Plan: Discuss psychosocial stressors and his self abusive behavior.  He has superficially scratches on his leg but no  active injury or bleeding.  Encouraged to see Glena Norfolk and discusses maladaptive behavior.  I would discontinue Wellbutrin since patient is not taking regularly.  We will try Lamictal to help his irritability and self abusive behavior.  Start Lamictal 25 mg daily for 1 week and then 50 mg daily.  Reminded that if he developed a rash that he needed to stop the medication immediately.  I also recommended to watches calories and to regular exercise.  He has gained weight from the past.  Continue Fanapt 4 mg at bedtime.  Patient has no tremors, shakes or any EPS.  Follow-up in 6 weeks. Discussed medication side effects and benefits.  Recommended to call us back if there  is any question, concern or worsening of the symptoms.  Discuss safety plan that anytime having active suicidal thoughts or homicidal thoughts and she need to call 911 or go to the local emergency room.   Lira Stephen T., MD 04/25/2016, 9:06 AM

## 2016-04-29 ENCOUNTER — Other Ambulatory Visit (HOSPITAL_COMMUNITY): Payer: Self-pay | Admitting: Psychiatry

## 2016-04-29 DIAGNOSIS — F321 Major depressive disorder, single episode, moderate: Secondary | ICD-10-CM

## 2016-05-02 ENCOUNTER — Other Ambulatory Visit (HOSPITAL_COMMUNITY): Payer: Self-pay | Admitting: Psychiatry

## 2016-05-02 NOTE — Telephone Encounter (Signed)
Medicine discontinued as patient is not taking it regularly.

## 2016-05-17 ENCOUNTER — Telehealth: Payer: Self-pay | Admitting: Physician Assistant

## 2016-05-17 ENCOUNTER — Ambulatory Visit (INDEPENDENT_AMBULATORY_CARE_PROVIDER_SITE_OTHER): Payer: BLUE CROSS/BLUE SHIELD | Admitting: Psychology

## 2016-05-17 DIAGNOSIS — F330 Major depressive disorder, recurrent, mild: Secondary | ICD-10-CM | POA: Diagnosis not present

## 2016-05-17 DIAGNOSIS — F840 Autistic disorder: Secondary | ICD-10-CM

## 2016-05-17 NOTE — Telephone Encounter (Signed)
Caller name: Kenney Housemananya Relation to pt: mother Call back [phone] or [phone]  Pharmacy:  Reason for call:  Pt's mother would like to know if son can get a Tetanus shot (pt is due- mother states), so that [facility] Please advise

## 2016-05-17 NOTE — Progress Notes (Signed)
   THERAPIST PROGRESS NOTE  Session Time: 8.13am-8.45am  Participation Level: Minimal  Behavioral Response: Fairly GroomedAlert- Tired  Type of Therapy: Individual Therapy  Treatment Goals addressed: Diagnosis: MDD, Austism, ADHD and goal 1.  Interventions: CBT  Summary: Terry HookerMicah Hutchinson is a 21 y.o. male who presents with report of being really tired.  He arrived late for appointment.  Pt reported he went to bed at 9pm last night and woke just before appointment.  Pt appears tired.  Pt reported that he is looking for a second job as many bills to pay w/ lawyer and medical bills.  Pt reports a 2nd interview w/ [employer] today and feels good about this opportunity.  Pt reported that he made his appearance for court- being continued each month.  Pt reported that his mood has been good- denies depressed mood, denies anger or aggression, denies any anxieties recent.  Pt states I don't have much to talk about.  Pt reports that he is getting out of the house more and socializing w/ a friend.  Pt reported no cutting in awhile- at least a month- pt either guarded about or poor insight into potential triggers for in past.  Suicidal/Homicidal: Nowithout intent/plan  Therapist Response: Assessed pt current functioning per pt report.  Explored w/pt interactions at home, work and community.  Explored mood and hx of cutting as indicated in Dr. Sheela StackArfeen's documentation.  Processed w/pt decision for second job and balancing 2 jobs.    Plan: Return again in 2 weeks.  Diagnosis: MDD, mild; Autism Spectrum D/O    Forde RadonYATES,LEANNE, [facility] 05/17/2016

## 2016-05-17 NOTE — Telephone Encounter (Signed)
He has TDap on record from 10/2007. As such he would be due next fall for a shot.

## 2016-05-17 NOTE — Telephone Encounter (Signed)
Pt's mother was informed the below and pt understood.

## 2016-05-22 NOTE — Telephone Encounter (Signed)
He can switch to me but reviewed his recent chart and want him to continue to see behavioral health/psychiatry. How is his mood? Would he mind scheduling with me within 2 weeks. Don't switch me as his pcp until he comes in.

## 2016-06-08 ENCOUNTER — Ambulatory Visit (INDEPENDENT_AMBULATORY_CARE_PROVIDER_SITE_OTHER): Payer: BLUE CROSS/BLUE SHIELD | Admitting: Psychology

## 2016-06-08 DIAGNOSIS — F330 Major depressive disorder, recurrent, mild: Secondary | ICD-10-CM

## 2016-06-08 DIAGNOSIS — F840 Autistic disorder: Secondary | ICD-10-CM

## 2016-06-08 NOTE — Progress Notes (Signed)
   THERAPIST PROGRESS NOTE  Session Time: 9.12am-9.32am  Participation Level: Minimal  Behavioral Response: Fairly GroomedAlertaffect wnl  Type of Therapy: Individual Therapy  Treatment Goals addressed: Diagnosis: MDD and goal 1.  Interventions: CBT and Motivational Interviewing  Summary: Terry Hutchinson is a 21 y.o. male who presents with affect wnl.  Pt appears tired reports mom just woke informing of appointment.  Pt reported that he is doing well and nothing to talk about. Pt reports no depressed mood.  Pt reports hanging out w/ friends more and enjoying.  Pt reports interactions w/ parent have been fine. Pt reports no stressor since last visit.  Pt reports some difficulty w/ falling asleep. Pt reports he doesn't know what he is getting from counseling or what he wants from counseling. Pt agrees to identify for next session.  Pt reports he has to leave to get things done.    Suicidal/Homicidal: Nowithout intent/plan  Therapist Response: Assessed pt current functioning per pt report. Processed w/pt interactions w/ family and peers.  Explored work interactions.  Completed PHq9.  Reflected minimal engagement and pt motivation for counseling.    Plan: Return again in 2 weeks.  Diagnosis: MDD, mild.     [name], [facility] 06/08/2016

## 2016-06-15 ENCOUNTER — Ambulatory Visit (INDEPENDENT_AMBULATORY_CARE_PROVIDER_SITE_OTHER): Payer: BLUE CROSS/BLUE SHIELD | Admitting: Psychiatry

## 2016-06-15 ENCOUNTER — Encounter (HOSPITAL_COMMUNITY): Payer: Self-pay | Admitting: Psychiatry

## 2016-06-15 DIAGNOSIS — F331 Major depressive disorder, recurrent, moderate: Secondary | ICD-10-CM | POA: Diagnosis not present

## 2016-06-15 DIAGNOSIS — Z818 Family history of other mental and behavioral disorders: Secondary | ICD-10-CM

## 2016-06-15 DIAGNOSIS — F913 Oppositional defiant disorder: Secondary | ICD-10-CM

## 2016-06-15 DIAGNOSIS — F988 Other specified behavioral and emotional disorders with onset usually occurring in childhood and adolescence: Secondary | ICD-10-CM | POA: Diagnosis not present

## 2016-06-15 DIAGNOSIS — Z811 Family history of alcohol abuse and dependence: Secondary | ICD-10-CM

## 2016-06-15 DIAGNOSIS — F845 Asperger's syndrome: Secondary | ICD-10-CM | POA: Diagnosis not present

## 2016-06-15 DIAGNOSIS — Z79899 Other long term (current) drug therapy: Secondary | ICD-10-CM | POA: Diagnosis not present

## 2016-06-15 MED ORDER — ILOPERIDONE 4 MG PO TABS: 4.0000 mg | ORAL_TABLET | Freq: Every day | ORAL | 2 refills | Status: DC

## 2016-06-15 MED ORDER — LAMOTRIGINE 25 MG PO TABS: ORAL_TABLET | ORAL | 2 refills | Status: DC

## 2016-06-15 NOTE — Progress Notes (Signed)
BH MD/PA/NP OP Progress Note  06/15/2016 8:44 AM Terry Hutchinson  MRN:  [id number]  Chief Complaint:  Subjective:  I like new medication.  It is helping my mood.  HPI: Terry Hutchinson came for his follow-up appointment.  He came by himself usually he comes with his mother.  On his last visit we started him on Lamictal and he has noticed much improvement in his mood.  He is working 10-15 hours at [employer] and he really likes his job.  He cut down his technology time.  His mother restrict him to use to college he because he was spending too much time and being disrespectful and defiant.  Since started Lamictal he is not engaged in any self abusive behavior.  He sleeping better.  He denies any anger, irritability, mood swing or any suicidal thoughts.  Patient is not sure about his next court date.  He was charged for sexual offense.  His appetite is okay.  His energy level is good.  He is seeing Terry Hutchinson for counseling.  He has no rash, itching, tremors or any shakes.  Some nights he has difficulty sleeping but denies any paranoia or any hallucination.  Visit Diagnosis:    ICD-9-CM ICD-10-CM   1. Asperger syndrome 299.80 F84.5 lamoTRIgine (LAMICTAL) 25 MG tablet     iloperidone (FANAPT) 4 MG TABS tablet  2. Moderate episode of recurrent major depressive disorder (HCC) 296.32 F33.1 lamoTRIgine (LAMICTAL) 25 MG tablet     iloperidone (FANAPT) 4 MG TABS tablet    Past Psychiatric History: Reviewed. Patient has history of oppositional defiant disorder, Asperger, ADD and major depressive disorder. He has multiple hospitalization. He was admitted in April 2017 and in January 2018 at [facility]. Patient has history of taking overdose on Naprosyn, in the past he had tried Lexapro . He was also taking Wellbutrin but due to history of noncompliance it was discontinued.  Patient has seen Dr. Ladona Ridgel, Dr. Lucianne Muss and Dr. Jarold Motto in this office.   Past Medical History:  Past Medical History:   Diagnosis Date  . ADHD (attention deficit hyperactivity disorder)   . Anemia   . Asperger's disorder   . Depression   . History of chicken pox   . Migraine   . Oppositional defiant disorder     Past Surgical History:  Procedure Laterality Date  . TONSILLECTOMY    . tubes in ears     in the past  . WISDOM TOOTH EXTRACTION      Family Psychiatric History: Reviewed.  Family History:  Family History  Problem Relation Age of Onset  . Depression Mother   . ADD / ADHD Father   . Alcohol abuse Maternal Grandfather   . Depression Maternal Grandmother     Social History:  Social History   Social History  . Marital status: Single    Spouse name: N/A  . Number of children: N/A  . Years of education: N/A   Social History Main Topics  . Smoking status: Never Smoker  . Smokeless tobacco: Never Used  . Alcohol use No  . Drug use: No  . Sexual activity: No   Other Topics Concern  . Not on file   Social History Narrative  . No narrative on file    Allergies:  Allergies  Allergen Reactions  . Peach [Prunus Persica] Swelling    Peach Fuzz  . Vantin Rash    Metabolic Disorder Labs: Lab Results  Component Value Date   HGBA1C 5.2  05/31/2015   MPG 103 05/31/2015   Lab Results  Component Value Date   PROLACTIN 19.6 (H) 05/30/2015   Lab Results  Component Value Date   CHOL 176 05/31/2015   TRIG 273 (H) 05/31/2015   HDL 47 05/31/2015   CHOLHDL 3.7 05/31/2015   VLDL 55 (H) 05/31/2015   LDLCALC 74 05/31/2015     Current Medications: Current Outpatient Prescriptions  Medication Sig Dispense Refill  . iloperidone (FANAPT) 4 MG TABS tablet Take 1 tablet (4 mg total) by mouth at bedtime. 30 tablet 1  . lamoTRIgine (LAMICTAL) 25 MG tablet Take 1 tab daily for 1 week and than 2 tab daily 60 tablet 1  . omega-3 fish oil (MAXEPA) 1000 MG CAPS capsule Take 1 capsule by mouth daily.     No current facility-administered medications for this visit.      Neurologic: Headache: No Seizure: No Paresthesias: No  Musculoskeletal: Strength & Muscle Tone: within normal limits Gait & Station: normal Patient leans: N/A  Psychiatric Specialty Exam: ROS  Blood pressure 122/68, pulse 79, height  (1.676 m), weight 155 lb 9.6 oz (70.6 kg).There is no height or weight on file to calculate BMI.  General Appearance: Casual and Superficially cooperative  Eye Contact:  Fair  Speech:  Slow  Volume:  Normal  Mood:  Euthymic  Affect:  Congruent  Thought Process:  Goal Directed  Orientation:  Full (Time, Place, and Person)  Thought Content: Logical   Suicidal Thoughts:  No  Homicidal Thoughts:  No  Memory:  Immediate;   Fair Recent;   Fair Remote;   Fair  Judgement:  Good  Insight:  Good  Psychomotor Activity:  Normal  Concentration:  Concentration: Fair and Attention Span: Fair  Recall:  [employer] of Knowledge: Fair  Language: Good  Akathisia:  No  Handed:  Right  AIMS (if indicated):  0  Assets:  Desire for Improvement Housing Social Support  ADL's:  Intact  Cognition: WNL  Sleep:  Improved     Assessment: Major depressive disorder, recurrent.  Oppositional defiant disorder.  ADD.  Asperger syndrome  Plan: Patient is doing better since started on Lamictal.  He has no rash, itching, tremors or any shakes.  His mood is improved and he is not engaged in any self abusive behavior.  I will continue Lamictal 50 mg daily and Fanapt 4 mg daily.  Patient has no concerns.  Encouraged to continue counseling with Terry Hutchinson.  Recommended to call us back if he has any question, concern or if any worsening of the symptom.  Follow-up in 3 months.  Terry Hutchinson T., MD 06/15/2016, 8:44 AM

## 2016-07-10 ENCOUNTER — Encounter (HOSPITAL_COMMUNITY): Payer: Self-pay | Admitting: Psychology

## 2016-07-10 ENCOUNTER — Ambulatory Visit (HOSPITAL_COMMUNITY): Payer: Self-pay | Admitting: Psychology

## 2016-07-10 NOTE — Progress Notes (Signed)
Terry EngMicah Katrinka Hutchinson is a 21 y.o. male patient who didn't come to his appointment.  Letter sent.        Forde RadonYATES,Emerly Prak, LPC

## 2016-09-14 ENCOUNTER — Ambulatory Visit (INDEPENDENT_AMBULATORY_CARE_PROVIDER_SITE_OTHER): Payer: BLUE CROSS/BLUE SHIELD | Admitting: Psychiatry

## 2016-09-14 ENCOUNTER — Encounter (HOSPITAL_COMMUNITY): Payer: Self-pay | Admitting: Psychiatry

## 2016-09-14 DIAGNOSIS — F331 Major depressive disorder, recurrent, moderate: Secondary | ICD-10-CM | POA: Diagnosis not present

## 2016-09-14 DIAGNOSIS — F845 Asperger's syndrome: Secondary | ICD-10-CM | POA: Diagnosis not present

## 2016-09-14 DIAGNOSIS — Z818 Family history of other mental and behavioral disorders: Secondary | ICD-10-CM

## 2016-09-14 DIAGNOSIS — F909 Attention-deficit hyperactivity disorder, unspecified type: Secondary | ICD-10-CM

## 2016-09-14 DIAGNOSIS — Z811 Family history of alcohol abuse and dependence: Secondary | ICD-10-CM

## 2016-09-14 DIAGNOSIS — F913 Oppositional defiant disorder: Secondary | ICD-10-CM | POA: Diagnosis not present

## 2016-09-14 MED ORDER — LAMOTRIGINE 25 MG PO TABS: ORAL_TABLET | ORAL | 2 refills | Status: DC

## 2016-09-14 NOTE — Progress Notes (Signed)
BH MD/PA/NP OP Progress Note  09/14/2016 8:31 AM Arnoldo HookerMicah Rawdon  MRN:  [id number]  Chief Complaint:  Subjective:  I'm doing fine.  I don't take the medication every day.  I like to come off from medication.  HPI: Katrinka BlazingSmith came for his follow-up appointment.  He is not taking his medication every day.  He like to come off from the medication.  He endorses doing much better.  He denies any irritability, anger, mania or any psychosis.  He is working 10-15 hours at [employer] and he did he likes his job.  He admitted sometime he feel joyful and very happy and people get annoyed with his behavior but he denies any impulsive behavior.  He spent too much time to himself but he feels proud that he cut down technology.  If he get bored that he watched TV.  He is not engaged in any self abusive behavior.  He sleeping better.  He had a good energy level.  He denies any crying spells or any feeling of hopelessness.  He is not seeing a Adella HareLeAnn Yates but he agreed that if he needed he will go back.  He is not sure about his next court date.  He was charged for sexual offense.  His appetite is okay.  Visit Diagnosis:    ICD-10-CM   1. Asperger syndrome F84.5 lamoTRIgine (LAMICTAL) 25 MG tablet  2. Moderate episode of recurrent major depressive disorder (HCC) F33.1 lamoTRIgine (LAMICTAL) 25 MG tablet    Past Psychiatric History: Reviewed. Patient has history of oppositional defiant disorder, Asperger, ADD and major depressive disorder. He has multiple hospitalization. He was admitted in April 2017 and inJanuary [facility]. Patient has history of taking overdose on Naprosyn. In the past he had tried Lexapro, Wellbutrin and Fanpat but due to noncompliance it was discontinued.  Patient has seen Dr. Ladona Ridgelaylor, Dr. Lucianne MussKumar and Dr. Jarold Mottoedapali in this office.   Past Medical History:  Past Medical History:  Diagnosis Date  . ADHD (attention deficit hyperactivity disorder)   . Anemia   . Asperger's  disorder   . Depression   . History of chicken pox   . Migraine   . Oppositional defiant disorder     Past Surgical History:  Procedure Laterality Date  . TONSILLECTOMY    . tubes in ears     in the past  . WISDOM TOOTH EXTRACTION      Family Psychiatric History: Reviewed.  Family History:  Family History  Problem Relation Age of Onset  . Depression Mother   . ADD / ADHD Father   . Alcohol abuse Maternal Grandfather   . Depression Maternal Grandmother     Social History:  Social History   Social History  . Marital status: Single    Spouse name: N/A  . Number of children: N/A  . Years of education: N/A   Social History Main Topics  . Smoking status: Never Smoker  . Smokeless tobacco: Never Used  . Alcohol use No  . Drug use: No  . Sexual activity: No   Other Topics Concern  . Not on file   Social History Narrative  . No narrative on file    Allergies:  Allergies  Allergen Reactions  . Peach [Prunus Persica] Swelling    Peach Fuzz  . Cefpodoxime   . Vantin Rash    Metabolic Disorder Labs: Lab Results  Component Value Date   HGBA1C 5.2 05/31/2015   MPG 103 05/31/2015  Lab Results  Component Value Date   PROLACTIN 19.6 (H) 05/30/2015   Lab Results  Component Value Date   CHOL 176 05/31/2015   TRIG 273 (H) 05/31/2015   HDL 47 05/31/2015   CHOLHDL 3.7 05/31/2015   VLDL 55 (H) 05/31/2015   LDLCALC 74 05/31/2015     Current Medications: Current Outpatient Prescriptions  Medication Sig Dispense Refill  . iloperidone (FANAPT) 4 MG TABS tablet Take 1 tablet (4 mg total) by mouth at bedtime. 30 tablet 2  . lamoTRIgine (LAMICTAL) 25 MG tablet Take 2 tab daily 60 tablet 2  . omega-3 fish oil (MAXEPA) 1000 MG CAPS capsule Take 1 capsule by mouth daily.     No current facility-administered medications for this visit.     Neurologic: Headache: No Seizure: No Paresthesias: No  Musculoskeletal: Strength & Muscle Tone: within normal  limits Gait & Station: normal Patient leans: N/A  Psychiatric Specialty Exam: Review of Systems  Musculoskeletal: Negative.   Skin: Negative for itching and rash.  Neurological: Negative.     Blood pressure 118/68, pulse 74, height 5\' 7"  (1.702 m), weight 142 lb (64.4 kg).There is no height or weight on file to calculate BMI.  General Appearance: Casual  Eye Contact:  Fair  Speech:  Clear and Coherent  Volume:  Normal  Mood:  Euthymic  Affect:  Appropriate  Thought Process:  Goal Directed  Orientation:  Full (Time, Place, and Person)  Thought Content: Logical   Suicidal Thoughts:  No  Homicidal Thoughts:  No  Memory:  Immediate;   Fair Recent;   Fair Remote;   Good  Judgement:  Fair  Insight:  Fair  Psychomotor Activity:  Normal  Concentration:  Concentration: Fair and Attention Span: Fair  Recall:  F[employer]Fair  Fund of Knowledge: Good  Language: Good  Akathisia:  No  Handed:  Right  AIMS (if indicated):  0  Assets:  Communication Skills Desire for Improvement Housing Physical Health Resilience Social Support  ADL's:  Intact  Cognition: WNL  Sleep:  Good    Assessment: Major depressive disorder, recurrent.  Oppositional defiant disorder.  ADD.  Asperger syndrome  Plan: Discuss in detail the risk of relapse due to noncompliance with medication.  However patient insists that he like to come off from the medication.  He's been poorly compliant with the medication.  After some encouragement he agreed to continue at least Lamictal for now.  We will consider discontinue Lamictal in the future if patient remains without symptoms.  He does not want to see Glena NorfolkLee Ann Yates.  Continue Lamictal 50 mg daily.  He has no rash, itching, tremors or shakes.  I will discontinue Fanpat due to noncompliance.  Discuss that if symptoms started to get worse then he need to call us immediately.  Follow-up in 3 months.  Terita Hejl T., MD 09/14/2016, 8:31 AM

## 2016-09-28 ENCOUNTER — Encounter (HOSPITAL_COMMUNITY): Payer: Self-pay | Admitting: Psychology

## 2016-09-28 NOTE — Progress Notes (Signed)
Terry Hutchinson is a 21 y.o. male patient discharged from counseling as last seen on 06/08/16.  Outpatient Therapist Discharge Summary  Terry Hutchinson    1996-01-18   Admission Date: 04/18/16   Discharge Date:  09/28/16 Reason for Discharge:  Not active w/ counseling Diagnosis:  Austism spectrum d/o;  MDD Comments:  Pt will continue w/ Dr. Lolly MustacheArfeen.    Alfredo BattyLeanne M Yates          YATES,LEANNE, LPC

## 2016-12-13 ENCOUNTER — Ambulatory Visit (HOSPITAL_COMMUNITY): Payer: BLUE CROSS/BLUE SHIELD | Admitting: Psychiatry

## 2016-12-17 DIAGNOSIS — Y929 Unspecified place or not applicable: Secondary | ICD-10-CM | POA: Insufficient documentation

## 2016-12-17 DIAGNOSIS — S71112A Laceration without foreign body, left thigh, initial encounter: Secondary | ICD-10-CM | POA: Insufficient documentation

## 2016-12-17 DIAGNOSIS — Y9389 Activity, other specified: Secondary | ICD-10-CM | POA: Insufficient documentation

## 2016-12-17 DIAGNOSIS — F845 Asperger's syndrome: Secondary | ICD-10-CM | POA: Diagnosis not present

## 2016-12-17 DIAGNOSIS — Y998 Other external cause status: Secondary | ICD-10-CM | POA: Insufficient documentation

## 2016-12-17 DIAGNOSIS — W260XXA Contact with knife, initial encounter: Secondary | ICD-10-CM | POA: Insufficient documentation

## 2016-12-17 DIAGNOSIS — Z23 Encounter for immunization: Secondary | ICD-10-CM | POA: Insufficient documentation

## 2016-12-18 ENCOUNTER — Encounter (HOSPITAL_BASED_OUTPATIENT_CLINIC_OR_DEPARTMENT_OTHER): Payer: Self-pay | Admitting: Emergency Medicine

## 2016-12-18 ENCOUNTER — Emergency Department (HOSPITAL_BASED_OUTPATIENT_CLINIC_OR_DEPARTMENT_OTHER)
Admission: EM | Admit: 2016-12-18 | Discharge: 2016-12-18 | Disposition: A | Discharge status: Home / Self Care | Payer: BLUE CROSS/BLUE SHIELD | Attending: Emergency Medicine | Admitting: Emergency Medicine

## 2016-12-18 DIAGNOSIS — S71111A Laceration without foreign body, right thigh, initial encounter: Secondary | ICD-10-CM

## 2016-12-18 MED ORDER — TETANUS-DIPHTH-ACELL PERTUSSIS 5-2.5-18.5 LF-MCG/0.5 IM SUSP
0.5000 mL | Freq: Once | INTRAMUSCULAR | Status: AC
  Administered 2016-12-18: 0.5 mL via INTRAMUSCULAR
  Filled 2016-12-18: qty 0.5

## 2016-12-18 MED ORDER — IBUPROFEN 400 MG PO TABS
600.0000 mg | ORAL_TABLET | Freq: Once | ORAL | Status: AC
  Administered 2016-12-18: 600 mg via ORAL
  Filled 2016-12-18: qty 1

## 2016-12-18 MED ORDER — LIDOCAINE HCL (PF) 1 % IJ SOLN
10.0000 mL | Freq: Once | INTRAMUSCULAR | Status: AC
  Administered 2016-12-18: 10 mL

## 2016-12-18 NOTE — ED Provider Notes (Signed)
[facility] EMERGENCY DEPARTMENT Provider Note   CSN: [id number] Arrival date & time: 12/17/16  2356     History   Chief Complaint Chief Complaint  Patient presents with  . Stab Wound    HPI Terry Hutchinson is a 21 y.o. male.  Patient is a 21 year old male with past medical history of ADHD and ODD.  He presents today for evaluation of a right thigh laceration.  He was throwing knives at a target when he accidentally stabbed himself in the right thigh.  Last tetanus shot was nearly 10 years ago.   The history is provided by the patient.    Past Medical History:  Diagnosis Date  . ADHD (attention deficit hyperactivity disorder)   . Anemia   . Asperger's disorder   . Depression   . History of chicken pox   . Migraine   . Oppositional defiant disorder     Patient Active Problem List   Diagnosis Date Noted  . Depressive disorder 03/17/2016  . Suicide attempt (HCC) 03/17/2016  . Ingrown toenail 12/28/2015  . Major depressive disorder, recurrent episode (HCC) 05/26/2015  . Suicide attempt by drug ingestion (HCC)   . ADD (attention deficit disorder) 02/20/2011  . Asperger's disorder 02/20/2011  . ODD (oppositional defiant disorder) 02/20/2011    Past Surgical History:  Procedure Laterality Date  . TONSILLECTOMY    . tubes in ears     in the past  . WISDOM TOOTH EXTRACTION         Home Medications    Prior to Admission medications   Medication Sig Start Date End Date Taking? Authorizing Provider  lamoTRIgine (LAMICTAL) 25 MG tablet Take 2 tab daily 09/14/16   Arfeen, Phillips GroutSyed T, MD  omega-3 fish oil (MAXEPA) 1000 MG CAPS capsule Take 1 capsule by mouth daily.    [provider]    Family History Family History  Problem Relation Age of Onset  . Depression Mother   . ADD / ADHD Father   . Alcohol abuse Maternal Grandfather   . Depression Maternal Grandmother     Social History Social History  Substance Use Topics  . Smoking status: Never  Smoker  . Smokeless tobacco: Never Used  . Alcohol use No     Allergies   Peach [prunus persica]; Cefpodoxime; and Vantin   Review of Systems Review of Systems  All other systems reviewed and are negative.    Physical Exam Updated Vital Signs BP 131/76 (BP Location: Right Arm)   Pulse 80   Temp 98.5 F (36.9 C) (Oral)   Resp 18   Ht 5\' 5"  (1.651 m)   Wt 68 kg (150 lb)   SpO2 98%   BMI 24.96 kg/m   Physical Exam  Constitutional: He is oriented to person, place, and time. He appears well-developed and well-nourished. No distress.  HENT:  Head: Normocephalic and atraumatic.  Neck: Normal range of motion. Neck supple.  Musculoskeletal:  There is a 3.5 cm laceration to the lateral aspect of the right mid thigh.  Bleeding is controlled.  Neurological: He is alert and oriented to person, place, and time.  Skin: Skin is warm and dry. He is not diaphoretic.  Nursing note and vitals reviewed.    ED Treatments / Results  Labs (all labs ordered are listed, but only abnormal results are displayed) Labs Reviewed - No data to display  EKG  EKG Interpretation None       Radiology No results found.  Procedures Procedures (  including critical care time)  Medications Ordered in ED Medications  Tdap (BOOSTRIX) injection 0.5 mL (not administered)  ibuprofen (ADVIL,MOTRIN) tablet 600 mg (not administered)     Initial Impression / Assessment and Plan / ED Course  I have reviewed the triage vital signs and the nursing notes.  Pertinent labs & imaging results that were available during my care of the patient were reviewed by me and considered in my medical decision making (see chart for details).  LACERATION REPAIR Performed by: Geoffery Lyons Authorized by: Geoffery Lyons Consent: Verbal consent obtained. Risks and benefits: risks, benefits and alternatives were discussed Consent given by: patient Patient identity confirmed: provided demographic data Prepped and  Draped in normal sterile fashion Wound explored  Laceration Location: Right thigh  Laceration Length: 2.5 cm  No Foreign Bodies seen or palpated  Anesthesia: local infiltration  Local anesthetic: lidocaine 2 % without epinephrine  Anesthetic total: 3 ml  Irrigation method: syringe Amount of cleaning: standard  Skin closure: 3-0 Prolene  Number of sutures: 5  Technique: Simple interrupted  Patient tolerance: Patient tolerated the procedure well with no immediate complications.   Final Clinical Impressions(s) / ED Diagnoses   Final diagnoses:  None    New Prescriptions New Prescriptions   No medications on file     Geoffery Lyons, MD 12/18/16 [phone]

## 2016-12-18 NOTE — ED Triage Notes (Signed)
PT presents with lac to right thigh. PT sts he was throwing knifes at boxes for fun and accidentally stabbed his right thigh.

## 2016-12-18 NOTE — ED Notes (Signed)
Alert, NAD, calm, interactive, resps e/u, speaking in clear complete sentences, no dyspnea noted, skin W&D, here for ~1" R thigh lac from pocket knife, c/o pain, no active bleeding, (denies: weakness, numbness, tingling, sob, nausea, dizziness or visual changes). [facility]

## 2016-12-18 NOTE — Discharge Instructions (Signed)
Local wound care with bacitracin and dressing changes twice daily.  Ibuprofen 600 mg every 6 hours as needed for pain.  Return to the emergency department for increased redness, red streaks extending up or down the leg, pus draining from the wound, or other new and concerning symptoms.  Sutures are to be removed in 10 days.  Please follow-up with your primary doctor for this.

## 2016-12-18 NOTE — ED Notes (Signed)
EDP at [facility] to suture 

## 2016-12-26 ENCOUNTER — Telehealth: Payer: Self-pay | Admitting: Physician Assistant

## 2016-12-26 NOTE — Telephone Encounter (Signed)
Ok with me 

## 2016-12-26 NOTE — Telephone Encounter (Signed)
Pt would like switch providers. [facility] and would like to switch to provider Ramon DredgeEdward.    Is this switch okay?

## 2016-12-26 NOTE — Telephone Encounter (Signed)
Yes please

## 2016-12-26 NOTE — Telephone Encounter (Signed)
Ok to switch to me

## 2016-12-26 NOTE — Telephone Encounter (Signed)
Esperanza RichtersGreat.  Edward do you need a 30 minute transition of care visit to be scheduled w/pt?

## 2016-12-27 ENCOUNTER — Ambulatory Visit (INDEPENDENT_AMBULATORY_CARE_PROVIDER_SITE_OTHER): Payer: BLUE CROSS/BLUE SHIELD | Admitting: Family Medicine

## 2016-12-27 ENCOUNTER — Encounter: Payer: Self-pay | Admitting: Family Medicine

## 2016-12-27 VITALS — BP 92/62 | HR 66 | Temp 97.6°F | Ht 64.0 in | Wt 137.4 lb

## 2016-12-27 DIAGNOSIS — Z4802 Encounter for removal of sutures: Secondary | ICD-10-CM | POA: Diagnosis not present

## 2016-12-27 NOTE — Progress Notes (Signed)
Chief Complaint  Patient presents with  . Suture / Staple Removal    right thigh   5 stitches in R thigh after sitting on a knife. Doing well, no complaints.  BP 92/62 (BP Location: Right Arm, Patient Position: Sitting, Cuff Size: Normal)   Pulse 66   Temp 97.6 F (36.4 C) (Oral)   Ht 5\' 4"  (1.626 m)   Wt 137 lb 6 oz (62.3 kg)   SpO2 97%   BMI 23.58 kg/m  Skin: Laceration that appears to be healing well over ant-lat R thigh Gen: Awake, alert  5 simple sutures removed. Pt tolerated well. Minor bleeding from scab removal, bandaid placed. Lac well approx, no gapping.  F/u prn. Pt voiced understanding and agreement to the plan.   Jilda Rocheicholas Paul PitmanWendling, OhioDO 11:03 AM 12/27/16

## 2016-12-27 NOTE — Progress Notes (Signed)
Pre visit review using our clinic review tool, if applicable. No additional management support is needed unless otherwise documented below in the visit note. 

## 2017-02-14 ENCOUNTER — Emergency Department (HOSPITAL_BASED_OUTPATIENT_CLINIC_OR_DEPARTMENT_OTHER): Payer: BLUE CROSS/BLUE SHIELD

## 2017-02-14 ENCOUNTER — Emergency Department (HOSPITAL_BASED_OUTPATIENT_CLINIC_OR_DEPARTMENT_OTHER)
Admission: EM | Admit: 2017-02-14 | Discharge: 2017-02-15 | Disposition: A | Discharge status: Home / Self Care | Payer: BLUE CROSS/BLUE SHIELD | Attending: Emergency Medicine | Admitting: Emergency Medicine

## 2017-02-14 ENCOUNTER — Other Ambulatory Visit: Payer: Self-pay

## 2017-02-14 ENCOUNTER — Encounter (HOSPITAL_BASED_OUTPATIENT_CLINIC_OR_DEPARTMENT_OTHER): Payer: Self-pay

## 2017-02-14 DIAGNOSIS — W2209XA Striking against other stationary object, initial encounter: Secondary | ICD-10-CM | POA: Diagnosis not present

## 2017-02-14 DIAGNOSIS — Y929 Unspecified place or not applicable: Secondary | ICD-10-CM | POA: Insufficient documentation

## 2017-02-14 DIAGNOSIS — Z9181 History of falling: Secondary | ICD-10-CM | POA: Insufficient documentation

## 2017-02-14 DIAGNOSIS — Y939 Activity, unspecified: Secondary | ICD-10-CM | POA: Insufficient documentation

## 2017-02-14 DIAGNOSIS — F845 Asperger's syndrome: Secondary | ICD-10-CM | POA: Diagnosis not present

## 2017-02-14 DIAGNOSIS — S6992XA Unspecified injury of left wrist, hand and finger(s), initial encounter: Secondary | ICD-10-CM | POA: Diagnosis present

## 2017-02-14 DIAGNOSIS — S62002A Unspecified fracture of navicular [scaphoid] bone of left wrist, initial encounter for closed fracture: Secondary | ICD-10-CM | POA: Insufficient documentation

## 2017-02-14 DIAGNOSIS — Y998 Other external cause status: Secondary | ICD-10-CM | POA: Insufficient documentation

## 2017-02-14 MED ORDER — IBUPROFEN 600 MG PO TABS: 600.0000 mg | ORAL_TABLET | Freq: Four times a day (QID) | ORAL | 0 refills | Status: DC | PRN

## 2017-02-14 NOTE — ED Triage Notes (Signed)
Pt c/o pain to left hand from a fall 1-2 weeks ago-NAD-steady gait

## 2017-02-14 NOTE — ED Provider Notes (Signed)
[facility] EMERGENCY DEPARTMENT Provider Note   CSN: [id number] Arrival date & time: 02/14/17  2216     History   Chief Complaint Chief Complaint  Patient presents with  . Hand Injury    HPI Terry Hutchinson is a 21 y.o. male.  HPI  21 y.o. male, presents to the Emergency Department today due to left hand pain from fall x 1-2 weeks ago. Pt states he fell off of a ladder. Notes progressively worsening pain and worse when he struck hand against table earlier today. No numbness/tingling. Rates pain 3/10. Throbbing sensation. Worse with ROM around bottom of thumb. No meds PTA.    Past Medical History:  Diagnosis Date  . ADHD (attention deficit hyperactivity disorder)   . Anemia   . Asperger's disorder   . Depression   . History of chicken pox   . Migraine   . Oppositional defiant disorder     Patient Active Problem List   Diagnosis Date Noted  . Depressive disorder 03/17/2016  . Suicide attempt (HCC) 03/17/2016  . Ingrown toenail 12/28/2015  . Major depressive disorder, recurrent episode (HCC) 05/26/2015  . Suicide attempt by drug ingestion (HCC)   . ADD (attention deficit disorder) 02/20/2011  . Asperger's disorder 02/20/2011  . ODD (oppositional defiant disorder) 02/20/2011    Past Surgical History:  Procedure Laterality Date  . TONSILLECTOMY    . tubes in ears     in the past  . WISDOM TOOTH EXTRACTION         Home Medications    Prior to Admission medications   Not on File    Family History Family History  Problem Relation Age of Onset  . Depression Mother   . ADD / ADHD Father   . Alcohol abuse Maternal Grandfather   . Depression Maternal Grandmother     Social History Social History   Tobacco Use  . Smoking status: Never Smoker  . Smokeless tobacco: Never Used  Substance Use Topics  . Alcohol use: No  . Drug use: No     Allergies   Peach [prunus persica]; Cefpodoxime; and Vantin   Review of Systems Review of  Systems ROS reviewed and all are negative for acute change except as noted in the HPI.  Physical Exam Updated Vital Signs BP 117/81 (BP Location: Right Arm)   Pulse 76   Temp 98 F (36.7 C) (Oral)   Resp 20   Ht 5\' 6"  (1.676 m)   Wt 63.5 kg (140 lb)   SpO2 100%   BMI 22.60 kg/m   Physical Exam  Constitutional: He is oriented to person, place, and time. He appears well-developed and well-nourished.  HENT:  Head: Normocephalic and atraumatic.  Eyes: EOM are normal.  Cardiovascular: Normal rate and regular rhythm.  Pulmonary/Chest: Effort normal.  Abdominal: Soft.  Musculoskeletal: Normal range of motion.  Left hand TTP around scaphoid. No obvious deformity. ROM intact, but painful. NVI  Neurological: He is alert and oriented to person, place, and time.  Skin: Skin is warm and dry.  Psychiatric: He has a normal mood and affect. His behavior is normal. Thought content normal.  Nursing note and vitals reviewed.    ED Treatments / Results  Labs (all labs ordered are listed, but only abnormal results are displayed) Labs Reviewed - No data to display  EKG  EKG Interpretation None       Radiology Dg Hand Complete Left  Result Date: 02/14/2017 CLINICAL DATA:  21 year old male with fall  and left hand pain. EXAM: LEFT HAND - COMPLETE 3+ VIEW COMPARISON:  None. FINDINGS: There is a nondisplaced transverse fracture of the the waist of the scaphoid bone, most likely acute. Correlation with clinical exam and snuffbox tenderness recommended. No other acute fracture identified. There is no dislocation. The bones are well mineralized. The soft tissues appear unremarkable. IMPRESSION: Transverse fracture of the waist of the scaphoid. Electronically Signed   By: Elgie CollardArash  Radparvar M.D.   On: 02/14/2017 23:01    Procedures Procedures (including critical care time)  Medications Ordered in ED Medications - No data to display   Initial Impression / Assessment and Plan / ED Course  I  have reviewed the triage vital signs and the nursing notes.  Pertinent labs & imaging results that were available during my care of the patient were reviewed by me and considered in my medical decision making (see chart for details).  Final Clinical Impressions(s) / ED Diagnoses   {I have reviewed and evaluated the relevant imaging studies.  {I have reviewed the relevant previous healthcare records.  {I obtained HPI from historian.   ED Course:  Assessment: Patient X-Ray shows transverse fracture at waist of scaphoid. Pt advised to follow up with orthopedics. Patient given thumb spica while in ED, conservative therapy recommended and discussed. Definitive fracture management provided. Patient will be discharged home & is agreeable with above plan. Returns precautions discussed. Pt appears safe for discharge  Disposition/Plan:  DC Home Additional Verbal discharge instructions given and discussed with patient.  Pt Instructed to f/u with Ortho in the next week for evaluation and treatment of symptoms. Return precautions given Pt acknowledges and agrees with plan  Supervising Physician Molpus, John, MD  Final diagnoses:  Closed nondisplaced fracture of scaphoid of left wrist, unspecified portion of scaphoid, initial encounter    ED Discharge Orders    None       Audry PiliMohr, Samyukta Cura, PA-C 02/14/17 2356    Arby BarrettePfeiffer, Marcy, MD 02/15/17 [phone]

## 2017-02-14 NOTE — Discharge Instructions (Signed)
Please read and follow all provided instructions.  Your diagnoses today include:  1. Closed nondisplaced fracture of scaphoid of left wrist, unspecified portion of scaphoid, initial encounter     Tests performed today include: Vital signs. See below for your results today.   Medications prescribed:  Take as prescribed   Home care instructions:  Follow any educational materials contained in this packet.  Follow-up instructions: Please follow-up with Orthopedics for further evaluation of symptoms and treatment   Return instructions:  Please return to the Emergency Department if you do not get better, if you get worse, or new symptoms OR  - Fever (temperature greater than 101.73F)  - Bleeding that does not stop with holding pressure to the area    -Severe pain (please note that you may be more sore the day after your accident)  - Chest Pain  - Difficulty breathing  - Severe nausea or vomiting  - Inability to tolerate food and liquids  - Passing out  - Skin becoming red around your wounds  - Change in mental status (confusion or lethargy)  - New numbness or weakness    Please return if you have any other emergent concerns.  Additional Information:  Your vital signs today were: BP 117/81 (BP Location: Right Arm)    Pulse 76    Temp 98 F (36.7 C) (Oral)    Resp 20    Ht 5\' 6"  (1.676 m)    Wt 63.5 kg (140 lb)    SpO2 100%    BMI 22.60 kg/m  If your blood pressure (BP) was elevated above 135/85 this visit, please have this repeated by your doctor within one month. ---------------

## 2017-09-05 ENCOUNTER — Encounter: Payer: Self-pay | Admitting: Family Medicine

## 2017-09-05 ENCOUNTER — Telehealth: Payer: Self-pay | Admitting: Physician Assistant

## 2017-09-05 ENCOUNTER — Ambulatory Visit (INDEPENDENT_AMBULATORY_CARE_PROVIDER_SITE_OTHER): Payer: BLUE CROSS/BLUE SHIELD | Admitting: Family Medicine

## 2017-09-05 VITALS — BP 102/68 | HR 88 | Temp 98.4°F | Ht 64.0 in | Wt 133.2 lb

## 2017-09-05 DIAGNOSIS — R110 Nausea: Secondary | ICD-10-CM | POA: Diagnosis not present

## 2017-09-05 DIAGNOSIS — R1030 Lower abdominal pain, unspecified: Secondary | ICD-10-CM

## 2017-09-05 MED ORDER — ONDANSETRON HCL 4 MG PO TABS: 4.0000 mg | ORAL_TABLET | Freq: Three times a day (TID) | ORAL | 0 refills | Status: DC | PRN

## 2017-09-05 MED ORDER — DICYCLOMINE HCL 10 MG PO CAPS: 10.0000 mg | ORAL_CAPSULE | Freq: Three times a day (TID) | ORAL | 0 refills | Status: DC

## 2017-09-05 NOTE — Telephone Encounter (Signed)
OK 

## 2017-09-05 NOTE — Telephone Encounter (Signed)
Ok with me 

## 2017-09-05 NOTE — Patient Instructions (Addendum)
Cancel appt if you are feeling better.  1-2 days to get the results of your labs back.   Stay hydrated.   Let us know if you need anything.

## 2017-09-05 NOTE — Progress Notes (Signed)
Chief Complaint  Patient presents with  . GI Problem    Terry Hutchinson is here for abdominal pain.  Duration: 6 days Nighttime awakenings? No Bleeding? No Weight loss? No Palliation: None Provocation: None Associated symptoms: nausea and vomiting Denies: fever, bowel changes Treatment to date: omeprazole, Cultrelle, naproxen Works in [employer], some contacts with similar s/s's.   ROS: Constitutional: No fevers GI: No D/C, no bleeding + pain  Past Medical History:  Diagnosis Date  . ADHD (attention deficit hyperactivity disorder)   . Anemia   . Asperger's disorder   . Depression   . History of chicken pox   . Migraine   . Oppositional defiant disorder    Family History  Problem Relation Age of Onset  . Depression Mother   . ADD / ADHD Father   . Alcohol abuse Maternal Grandfather   . Depression Maternal Grandmother    Past Surgical History:  Procedure Laterality Date  . TONSILLECTOMY    . tubes in ears     in the past  . WISDOM TOOTH EXTRACTION      BP 102/68 (BP Location: Left Arm, Patient Position: Sitting, Cuff Size: Normal)   Pulse 88   Temp 98.4 F (36.9 C) (Oral)   Ht 5\' 4"  (1.626 m)   Wt 133 lb 4 oz (60.4 kg)   SpO2 98%   BMI 22.87 kg/m  Gen.: Awake, alert, appears stated age HEENT: Mucous membranes moist without mucosal lesions Heart: Regular rate and rhythm without murmurs Lungs: Clear auscultation bilaterally, no rales or wheezing, normal effort without accessory muscle use. Abdomen: Bowel sounds are present. Abdomen is soft, TTP in lower quads, nondistended, no masses or organomegaly. Negative Murphy's, Rovsing's, McBurney's, and Carnett's sign. Psych: Age appropriate judgment and insight. Normal mood and affect.  Lower abdominal pain - Plan: dicyclomine (BENTYL) 10 MG capsule, Basic metabolic panel, CBC  Nausea - Plan: ondansetron (ZOFRAN) 4 MG tablet  Orders as above. Stay hydrated. Could be infection, will give more time. F/u in  2-3 business days. If no improvement, will check CT abd/pelv. Pt voiced understanding and agreement to the plan.  Jilda Rocheicholas Paul [place]ry[place]end[place], DO 09/05/17 1:57 PM

## 2017-09-05 NOTE — Telephone Encounter (Signed)
Pt is a patient of Marcelline MatesWilliam Martin. [facility] and would like to see Dr. Carmelia RollerWendling [facility] [place] [facility]. Ok to change PCP?

## 2017-09-05 NOTE — Progress Notes (Signed)
Pre visit review using our clinic review tool, if applicable. No additional management support is needed unless otherwise documented below in the visit note. 

## 2017-09-06 LAB — BASIC METABOLIC PANEL
BUN: 10 mg/dL (ref 6–23)
CALCIUM: 9.9 mg/dL (ref 8.4–10.5)
CO2: 32 mEq/L (ref 19–32)
Chloride: 105 mEq/L (ref 96–112)
Creatinine, Ser: 1.02 mg/dL (ref 0.40–1.50)
GFR: 97.29 mL/min (ref >60.00)
Glucose, Bld: 87 mg/dL (ref 70–99)
Potassium: 3.8 mEq/L (ref 3.5–5.1)
SODIUM: 143 meq/L (ref 135–145)

## 2017-09-06 LAB — CBC
HCT: 45.8 % (ref 39.0–52.0)
Hemoglobin: 15.7 g/dL (ref 13.0–17.0)
MCHC: 34.3 g/dL (ref 30.0–36.0)
MCV: 85.5 fl (ref 78.0–100.0)
PLATELETS: 271 10*3/uL (ref 150.0–400.0)
RBC: 5.35 Mil/uL (ref 4.22–5.81)
RDW: 12.7 % (ref 11.5–15.5)
WBC: 7.5 10*3/uL (ref 4.0–10.5)

## 2017-09-10 ENCOUNTER — Encounter: Payer: Self-pay | Admitting: Family Medicine

## 2017-09-10 ENCOUNTER — Ambulatory Visit: Payer: BLUE CROSS/BLUE SHIELD | Admitting: Family Medicine

## 2017-09-10 VITALS — BP 108/68 | HR 78 | Temp 97.8°F | Ht 65.0 in | Wt 136.1 lb

## 2017-09-10 DIAGNOSIS — R1030 Lower abdominal pain, unspecified: Secondary | ICD-10-CM | POA: Diagnosis not present

## 2017-09-10 DIAGNOSIS — R110 Nausea: Secondary | ICD-10-CM | POA: Diagnosis not present

## 2017-09-10 MED ORDER — ONDANSETRON HCL 4 MG PO TABS: 4.0000 mg | ORAL_TABLET | Freq: Three times a day (TID) | ORAL | 0 refills | Status: DC | PRN

## 2017-09-10 MED ORDER — GI COCKTAIL ~~LOC~~
30.0000 mL | Freq: Once | ORAL | Status: AC
  Administered 2017-09-10: 30 mL via ORAL

## 2017-09-10 NOTE — Addendum Note (Signed)
Addended by: Scharlene GlossEWING, ROBIN B on: 09/10/2017 02:11 PM   Modules accepted: Orders

## 2017-09-10 NOTE — Progress Notes (Signed)
Chief Complaint  Patient presents with  . Abdominal Pain    Subjective: Patient is a 22 y.o. male here for f/u abd pain.  Pt seen last week and rx'd Zofran and Bentyl. Reports some improvement, but still somewhat bothersome. +Nausea, foot makes worse. Vomiting better. No diarrhea, bleeding, fevers.   ROS: Abd: as noted in HPI  Past Medical History:  Diagnosis Date  . ADHD (attention deficit hyperactivity disorder)   . Anemia   . Asperger's disorder   . Depression   . History of chicken pox   . Migraine   . Oppositional defiant disorder     Objective: BP 108/68 (BP Location: Left Arm, Patient Position: Sitting, Cuff Size: Normal)   Pulse 78   Temp 97.8 F (36.6 C) (Oral)   Ht 5\' 5"  (1.651 m)   Wt 136 lb 2 oz (61.7 kg)   SpO2 97%   BMI 22.65 kg/m  General: Awake, appears stated age HEENT: MMM, EOMi Heart: RRR, no murmurs Lungs: CTAB, no rales, wheezes or rhonchi. No accessory muscle use Abd: +ttp in lower quad, less than last time; neg Murphy's, Rovsing's, Carnett's, McBurney's.  Psych: Age appropriate judgment and insight, normal affect and mood  Assessment and Plan: Lower abdominal pain  Nausea - Plan: ondansetron (ZOFRAN) 4 MG tablet  Orders as above. Cont meds. He is getting better. No change in plan for now.  F/u prn.  The patient voiced understanding and agreement to the plan.  Jilda Rocheicholas Paul BrandonWendling, DO 09/10/17  2:00 PM

## 2017-09-10 NOTE — Progress Notes (Signed)
Pre visit review using our clinic review tool, if applicable. No additional management support is needed unless otherwise documented below in the visit note. 

## 2017-09-10 NOTE — Patient Instructions (Addendum)
I am pleased with your progress thus far. I wouldn't change anything right now. If you stop getting better or get worse, let me know.  This does not sound like Crohn's, ulcerative colitis or an ulcer.   Let us know if you need anything.

## 2017-09-16 ENCOUNTER — Other Ambulatory Visit: Payer: Self-pay | Admitting: Family Medicine

## 2017-09-16 DIAGNOSIS — R1030 Lower abdominal pain, unspecified: Secondary | ICD-10-CM

## 2018-01-07 ENCOUNTER — Ambulatory Visit (HOSPITAL_COMMUNITY): Payer: Self-pay | Admitting: Psychiatry

## 2018-01-07 ENCOUNTER — Encounter

## 2018-01-23 ENCOUNTER — Other Ambulatory Visit: Payer: Self-pay

## 2018-01-23 ENCOUNTER — Emergency Department (HOSPITAL_BASED_OUTPATIENT_CLINIC_OR_DEPARTMENT_OTHER)
Admission: EM | Admit: 2018-01-23 | Discharge: 2018-01-23 | Disposition: A | Discharge status: Home / Self Care | Payer: BLUE CROSS/BLUE SHIELD | Attending: Emergency Medicine | Admitting: Emergency Medicine

## 2018-01-23 ENCOUNTER — Encounter (HOSPITAL_BASED_OUTPATIENT_CLINIC_OR_DEPARTMENT_OTHER): Payer: Self-pay | Admitting: *Deleted

## 2018-01-23 DIAGNOSIS — Y990 Civilian activity done for income or pay: Secondary | ICD-10-CM | POA: Diagnosis not present

## 2018-01-23 DIAGNOSIS — F845 Asperger's syndrome: Secondary | ICD-10-CM | POA: Diagnosis not present

## 2018-01-23 DIAGNOSIS — Y929 Unspecified place or not applicable: Secondary | ICD-10-CM | POA: Diagnosis not present

## 2018-01-23 DIAGNOSIS — F329 Major depressive disorder, single episode, unspecified: Secondary | ICD-10-CM | POA: Diagnosis not present

## 2018-01-23 DIAGNOSIS — Y9389 Activity, other specified: Secondary | ICD-10-CM | POA: Insufficient documentation

## 2018-01-23 DIAGNOSIS — S61011A Laceration without foreign body of right thumb without damage to nail, initial encounter: Secondary | ICD-10-CM

## 2018-01-23 DIAGNOSIS — F913 Oppositional defiant disorder: Secondary | ICD-10-CM | POA: Insufficient documentation

## 2018-01-23 DIAGNOSIS — W268XXA Contact with other sharp object(s), not elsewhere classified, initial encounter: Secondary | ICD-10-CM | POA: Diagnosis not present

## 2018-01-23 DIAGNOSIS — F909 Attention-deficit hyperactivity disorder, unspecified type: Secondary | ICD-10-CM | POA: Insufficient documentation

## 2018-01-23 NOTE — ED Triage Notes (Signed)
Pt c/o lac to right thumb by box cutter at work x 1 hr ago

## 2018-01-23 NOTE — ED Provider Notes (Signed)
[facility] EMERGENCY DEPARTMENT Provider Note   CSN: [id number] Arrival date & time: 01/23/18  1647     History   Chief Complaint Chief Complaint  Patient presents with  . Laceration    HPI Terry Hutchinson is a 22 y.o. male with a history of ADHD, anemia, and Asperger's who presents to the emergency department for laceration to right thumb which occurred 1 hour prior to arrival.  Patient states that he cut his thumb with the safety portion of a box cutter.  He states he did this while he was at work. Bleeding controlled.  Mildly uncomfortable.  No specific alleviating or aggravating factors.  No intervention prior to arrival. Denies numbness, weakness, or other areas of injury. Last tetanus 1 year prior.   HPI  Past Medical History:  Diagnosis Date  . ADHD (attention deficit hyperactivity disorder)   . Anemia   . Asperger's disorder   . Depression   . History of chicken pox   . Migraine   . Oppositional defiant disorder     Patient Active Problem List   Diagnosis Date Noted  . Depressive disorder 03/17/2016  . Suicide attempt (HCC) 03/17/2016  . Ingrown toenail 12/28/2015  . Major depressive disorder, recurrent episode (HCC) 05/26/2015  . Suicide attempt by drug ingestion (HCC)   . ADD (attention deficit disorder) 02/20/2011  . Asperger's disorder 02/20/2011  . ODD (oppositional defiant disorder) 02/20/2011    Past Surgical History:  Procedure Laterality Date  . TONSILLECTOMY    . tubes in ears     in the past  . WISDOM TOOTH EXTRACTION          Home Medications    Prior to Admission medications   Not on File    Family History Family History  Problem Relation Age of Onset  . Depression Mother   . ADD / ADHD Father   . Alcohol abuse Maternal Grandfather   . Depression Maternal Grandmother     Social History Social History   Tobacco Use  . Smoking status: Never Smoker  . Smokeless tobacco: Never Used  Substance Use Topics  . Alcohol  use: No  . Drug use: No     Allergies   Peach [prunus persica]; Cefpodoxime; and Vantin   Review of Systems Review of Systems  Constitutional: Negative for chills and fever.  Skin: Positive for wound.  Neurological: Negative for weakness and numbness.     Physical Exam Updated Vital Signs BP 114/62 (BP Location: Left Arm)   Pulse 75   Temp 98.2 F (36.8 C) (Oral)   Resp 18   Ht 5\' 6"  (1.676 m)   Wt 63.5 kg   SpO2 100%   BMI 22.60 kg/m   Physical Exam  Constitutional: He appears well-developed and well-nourished. No distress.  HENT:  Head: Normocephalic and atraumatic.  Eyes: Conjunctivae are normal. Right eye exhibits no discharge. Left eye exhibits no discharge.  Cardiovascular:  Pulses:      Radial pulses are 2+ on the right side, and 2+ on the left side.  Musculoskeletal:  Upper extremities: 0.5 cm linear very superficial laceration to the pad of the R thumb. No active bleeding, no appreciable FB. No ecchymosis, no drainage, no erythema. IP/MCP joint ROM intact. No bony tenderness. NVI distally.   Neurological: He is alert.  Clear speech. Sensation grossly intact to bilateral upper extremities. 5/5 symmetric grip strength. Able to perform OK sign, thumbs up and cross 2nd/3rd digits.   Psychiatric: He has  a normal mood and affect. His behavior is normal. Thought content normal.  Nursing note and vitals reviewed.    ED Treatments / Results  Labs (all labs ordered are listed, but only abnormal results are displayed) Labs Reviewed - No data to display  EKG None  Radiology No results found.  Procedures .Marland KitchenLaceration Repair Date/Time: 01/23/2018 5:26 PM Performed by: Cherly Anderson, PA-C Authorized by: Cherly Anderson, PA-C   Consent:    Consent obtained:  Verbal   Consent given by:  Patient   Risks discussed:  Infection, need for additional repair, nerve damage, poor wound healing, poor cosmetic result, pain, retained foreign body, tendon  damage and vascular damage   Alternatives discussed:  No treatment Anesthesia (see MAR for exact dosages):    Anesthesia method:  None Laceration details:    Location:  Finger   Finger location:  R thumb   Length (cm):  0.5   Depth (mm):  1 Repair type:    Repair type:  Simple Exploration:    Hemostasis achieved with:  Direct pressure   Wound exploration: wound explored through full range of motion and entire depth of wound probed and visualized   Treatment:    Area cleansed with:  Betadine   Amount of cleaning:  Standard   Irrigation solution:  Sterile water   Irrigation method:  Pressure wash Skin repair:    Repair method:  Steri-Strips   Number of Steri-Strips:  2 Approximation:    Approximation:  Close Post-procedure details:    Dressing:  Adhesive bandage   Patient tolerance of procedure:  Tolerated well, no immediate complications   (including critical care time)  Medications Ordered in ED Medications - No data to display   Initial Impression / Assessment and Plan / ED Course  I have reviewed the triage vital signs and the nursing notes.  Pertinent labs & imaging results that were available during my care of the patient were reviewed by me and considered in my medical decision making (see chart for details).   Patient presents to the ED with superficial laceration to the R thumb. Pressure irrigation performed, base of wound visualized in a bloodless field without evidence of FB. Does not appear to require closure with sutures, steri strips applied for patient re-assurance. Tetanus is up to date. Mechanism and exam lead to low suspicion for underlying fracture. NVI distally.  PCP recheck. Wound care and return precautions discussed.   Final Clinical Impressions(s) / ED Diagnoses   Final diagnoses:  Laceration of right thumb without foreign body without damage to nail, initial encounter    ED Discharge Orders    None       Cherly Anderson, PA-C 01/23/18  1737    Maia Plan, MD 01/24/18 1127

## 2018-01-23 NOTE — Discharge Instructions (Addendum)
Keep area clean and dry. Cover with bandage when out in public. Return to ER for redness/drainage/fevers or any other concerns.

## 2018-03-06 ENCOUNTER — Ambulatory Visit (HOSPITAL_COMMUNITY): Payer: BLUE CROSS/BLUE SHIELD | Admitting: Psychiatry

## 2018-04-16 ENCOUNTER — Encounter (HOSPITAL_COMMUNITY): Payer: Self-pay | Admitting: Psychiatry

## 2018-04-16 ENCOUNTER — Ambulatory Visit (INDEPENDENT_AMBULATORY_CARE_PROVIDER_SITE_OTHER): Payer: BLUE CROSS/BLUE SHIELD | Admitting: Psychiatry

## 2018-04-16 VITALS — BP 122/70 | Ht 66.0 in | Wt 134.0 lb

## 2018-04-16 DIAGNOSIS — F902 Attention-deficit hyperactivity disorder, combined type: Secondary | ICD-10-CM

## 2018-04-16 DIAGNOSIS — F330 Major depressive disorder, recurrent, mild: Secondary | ICD-10-CM

## 2018-04-16 DIAGNOSIS — F845 Asperger's syndrome: Secondary | ICD-10-CM | POA: Diagnosis not present

## 2018-04-16 NOTE — Progress Notes (Signed)
BH MD/PA/NP OP Progress Note  04/16/2018 8:58 AM Terry Hutchinson  MRN:  [id number]  Chief Complaint: I need a letter that I am doing good.  HPI: Terry Hutchinson came alone for his appointment.  He was last seen in July 2018.  At that time he decided to come off from the medication because he was not taking it.  We have encouraged him to take at least Lamictal to help his irritability and mood.  However he decided to stop the medication and since then he has been not taking any psychotropic medication.  Today he came and requested a letter to be given to help him from parole.  Patient was charged with sexual offense in 2017.  Since then he is on parole.  Patient appears anxious about the code.  He told that he has not taking the medication because he feel his anxiety, ADD, depression is subsided.  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  He is hoping to get permanent employed there.  In the past 2 years he had work in [employer].  He denies any depression, suicidal thoughts, paranoia, hallucination or any new legal issues.  However he appears upset and frustrated about his current parole situation.  He endorsed getting therapy for rehabilitation once a month which is court mandated.  He feel he will learn a lot and like to continue therapy.  He is not in any relationship.  He is sleeping good.  He claims his relationship with the family member is good.  He lives with his parents, sister and grand father.  He denies drinking or using any illegal substances.  He denies any mania but appears irritable when talking about court issues.  He is not seeing [name].  His appetite is okay.  His energy level is okay.   Visit Diagnosis:    ICD-10-CM   1. Asperger's disorder F84.5   2. Attention deficit hyperactivity disorder (ADHD), combined type F90.2   3. Mild episode of recurrent major depressive disorder (HCC) F33.0     Past Psychiatric History: Reviewed. H/O oppositional defiant disorder, Asperger,  ADD and MDD. H/O multiple hospitalization. Last admission in January 2018 at [facility]. H/O overdose on Naprosyn. Tried Lexapro, Wellbutrin, Fanapt vut d/c due to noncompliant. Seen Dr. Ladona Ridgel, Dr. Lucianne Muss and Dr. Jarold Motto in this office.   Past Medical History:  Past Medical History:  Diagnosis Date  . ADHD (attention deficit hyperactivity disorder)   . Anemia   . Asperger's disorder   . Depression   . History of chicken pox   . Migraine   . Oppositional defiant disorder     Past Surgical History:  Procedure Laterality Date  . TONSILLECTOMY    . tubes in ears     in the past  . WISDOM TOOTH EXTRACTION      Family Psychiatric History: Reviewed.  Family History:  Family History  Problem Relation Age of Onset  . Depression Mother   . ADD / ADHD Father   . Alcohol abuse Maternal Grandfather   . Depression Maternal Grandmother     Social History:  Social History   Socioeconomic History  . Marital status: Single    Spouse name: Not on file  . Number of children: Not on file  . Years of education: Not on file  . Highest education level: Not on file  Occupational History  . Not on file  Social Needs  . Financial resource strain: Not on file  . Food  insecurity:    Worry: Not on file    Inability: Not on file  . Transportation needs:    Medical: Not on file    Non-medical: Not on file  Tobacco Use  . Smoking status: Never Smoker  . Smokeless tobacco: Never Used  Substance and Sexual Activity  . Alcohol use: No  . Drug use: No  . Sexual activity: Not Currently  Lifestyle  . Physical activity:    Days per week: Not on file    Minutes per session: Not on file  . Stress: Not on file  Relationships  . Social connections:    Talks on phone: Not on file    Gets together: Not on file    Attends religious service: Not on file    Active member of club or organization: Not on file    Attends meetings of clubs or organizations: Not on file    Relationship status: Not on file   Other Topics Concern  . Not on file  Social History Narrative  . Not on file    Allergies:  Allergies  Allergen Reactions  . Peach [Prunus Persica] Swelling    Peach Fuzz  . Cefpodoxime   . Vantin Rash    Metabolic Disorder Labs: Lab Results  Component Value Date   HGBA1C 5.2 05/31/2015   MPG 103 05/31/2015   Lab Results  Component Value Date   PROLACTIN 19.6 (H) 05/30/2015   Lab Results  Component Value Date   CHOL 176 05/31/2015   TRIG 273 (H) 05/31/2015   HDL 47 05/31/2015   CHOLHDL 3.7 05/31/2015   VLDL 55 (H) 05/31/2015   LDLCALC 74 05/31/2015   No results found for: TSH  Therapeutic Level Labs: No results found for: LITHIUM No results found for: VALPROATE No components found for:  CBMZ  Current Medications: No current outpatient medications on file.   No current facility-administered medications for this visit.      Musculoskeletal: Strength & Muscle Tone: within normal limits Gait & Station: normal Patient leans: N/A  Psychiatric Specialty Exam: ROS  Blood pressure 122/70, height 5\' 6"  (1.676 m), weight 134 lb (60.8 kg).Body mass index is 21.63 kg/m.  General Appearance: Casual  Eye Contact:  Fair  Speech:  Clear and Coherent  Volume:  Normal  Mood:  Anxious and irritable when talking about court issues.  Affect:  Congruent  Thought Process:  Descriptions of Associations: Intact  Orientation:  Full (Time, Place, and Person)  Thought Content: Preoccupied with court dates and coming off from parole.   Suicidal Thoughts:  No  Homicidal Thoughts:  No  Memory:  Immediate;   Good Recent;   Good Remote;   Good  Judgement:  Fair  Insight:  Fair  Psychomotor Activity:  Slightly increased  Concentration:  Concentration: Fair and Attention Span: Fair  Recall:  Good  Fund of Knowledge: Good  Language: Good  Akathisia:  No  Handed:  Right  AIMS (if indicated): not done  Assets:  Communication Skills Desire for Improvement Social  Support Others:  Working  ADL's:  Intact  Cognition: WNL  Sleep:  Good   Screenings: AIMS     [facility]  AIMS Total Score  0    AUDIT     Ad[facility]  Alcohol Use Disorder Identification Test Final Score (AUDIT)  0    PHQ2-9  Counselor from 06/08/2016 [facility] [place] Counselor from 04/18/2016 in [facility] [place]  PHQ-2 Total Score  0  1  PHQ-9 Total Score  3  1       Assessment and Plan: Khyron Hollenberg is 23 year old Caucasian currently employed male with a history of as burgers, oppositional defiant, attention deficit disorder and major depression.  Patient is noncompliant with medication.  He feels that he does not need the medication since he learned a lot through the circumstances and currently in in therapy at rehabilitation center mandate by court order.  Today he need a letter from [place] that he does not need the medication.  We explained that he has not seeing [place] for more than a year and a half, we cannot provide a clearance letter that he does not need medication.  However I explained that we are happy to provide the records from the office if needed to the court system.  Patient agree with the plan.  We discussed safety concerns at any time having active suicidal thoughts or homicidal thoughts then he should call 911 or go to local emergency room.  At this time patient does not feel he need medication and does not need new appointment.  Cleotis Nipper, MD 04/16/2018, 8:58 AM

## 2018-05-22 ENCOUNTER — Other Ambulatory Visit: Payer: Self-pay

## 2018-05-22 ENCOUNTER — Ambulatory Visit (INDEPENDENT_AMBULATORY_CARE_PROVIDER_SITE_OTHER): Payer: BLUE CROSS/BLUE SHIELD | Admitting: Psychiatry

## 2018-05-22 DIAGNOSIS — F845 Asperger's syndrome: Secondary | ICD-10-CM

## 2018-05-22 DIAGNOSIS — F419 Anxiety disorder, unspecified: Secondary | ICD-10-CM | POA: Diagnosis not present

## 2018-05-22 MED ORDER — HYDROXYZINE HCL 10 MG PO TABS: ORAL_TABLET | ORAL | 0 refills | Status: DC

## 2018-05-22 NOTE — Progress Notes (Signed)
Virtual Visit via Telephone Note  I connected with Ivis Berends on 05/22/18 at 11:00 AM EDT by telephone and verified that I am speaking with the correct person using two identifiers.   I discussed the limitations, risks, security and privacy concerns of performing an evaluation and management service by telephone and the availability of in person appointments. I also discussed with the patient that there may be a patient responsible charge related to this service. The patient expressed understanding and agreed to proceed.   History of Present Illness: Patient was evaluated through phone.  He is experiencing anxiety and nervousness.  He is not sure what causing anxiety but believe due to pandemic coronavirus.  He is going to work every day.  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  He reported his anxiety does not occur every day but there are days when he feels overwhelmed, nervous and very anxious.  He is still on parole which may end in next 6 months.  He admitted some time anxiety is so bad that he does not sleep all night.  He lives with his parents, sister and grandfather.  Patient told family member are not taking very well quarantine and sometimes gets upset because they feel they are locked down in the house.  Patient told sometimes they get frustrated with me because I need to go outside for work.  He denies any paranoia, hallucination or any severe anger.  He denies any mood swings or any suicidal thoughts.  He reported his mood most of the time is okay.  Denies drinking or using any illegal substances.  He is compliant with court mandated once a month therapy at [facility].  He admitted that he will learn a lot with therapy.  He also reported his therapist noticed that he is anxious lately.  He is requesting to prescribe medication that helped his anxiety and nervousness.  He does not want to take the medication every day.  He reported his energy level is good.  He is pleased that he has a  steady job for a long time which she had never before.  He is not taking any other medication at this time.    Past Psychiatric History: Reviewed. H/O oppositional defiant disorder, Asperger, ADD and MDD. H/O multiple hospitalization. Last admission in January 2018 at [facility]. H/O overdose on Naprosyn.Tried Lexapro, Wellbutrin, Fanapt vut d/c due to noncompliant. Seen Dr. Ladona Ridgel, Dr. Lucianne Muss and Dr. Jarold Motto in this office.  Observations/Objective: Limited mental status examination done on the phone.  Patient described his mood as anxious.  His speech is fast but clear and coherent.  His thought process logical and goal-directed.  There were no flight of ideas.  He denies any auditory or visual hallucination.  He denies any suicidal thoughts or homicidal thought.  There were no delusions or any paranoia.  He is alert and oriented x3.  His fund of knowledge is average.  His cognition is grossly intact.  His insight judgment is okay.  He reported no weight gain or weight loss in recent months.  Assessment and Plan: Anxiety.  Asperger syndrome.  Discussed his current anxiety which could be due to pandemic coronavirus.  He wanted to take some medication to help his anxiety.  He does not want standing medication and rather like to take something as needed.  He admitted sometimes poor sleep and racing thoughts.  I reviewed his current medication list.  I will start hydroxyzine 10 mg to take 1 to 2  tablet as needed for anxiety.  I explained the medicine can cause sedation and sleepiness so he need to watch carefully.  He agree with the plan.  I reminded that if symptoms get worse or if he has any question or concern then he should call us immediately.  Patient like to follow-up in 2 months.  Follow Up Instructions:    I discussed the assessment and treatment plan with the patient. The patient was provided an opportunity to ask questions and all were answered. The patient agreed with the plan and demonstrated an  understanding of the instructions.   The patient was advised to call back or seek an in-person evaluation if the symptoms worsen or if the condition fails to improve as anticipated.  I provided 15 minutes of non-face-to-face time during this encounter.   Cleotis Nipper, MD

## 2018-07-23 ENCOUNTER — Encounter (HOSPITAL_COMMUNITY): Payer: Self-pay | Admitting: Psychiatry

## 2018-07-23 ENCOUNTER — Other Ambulatory Visit: Payer: Self-pay

## 2018-07-23 ENCOUNTER — Ambulatory Visit (INDEPENDENT_AMBULATORY_CARE_PROVIDER_SITE_OTHER): Payer: BC Managed Care – PPO | Admitting: Psychiatry

## 2018-07-23 DIAGNOSIS — F419 Anxiety disorder, unspecified: Secondary | ICD-10-CM | POA: Diagnosis not present

## 2018-07-23 DIAGNOSIS — F845 Asperger's syndrome: Secondary | ICD-10-CM

## 2018-07-23 MED ORDER — HYDROXYZINE HCL 10 MG PO TABS: ORAL_TABLET | ORAL | 0 refills | Status: AC

## 2018-07-23 NOTE — Progress Notes (Signed)
Virtual Visit via Telephone Note  I connected with Terry Hutchinson on 07/23/18 at 11:00 AM EDT by telephone and verified that I am speaking with the correct person using two identifiers.   I discussed the limitations, risks, security and privacy concerns of performing an evaluation and management service by telephone and the availability of in person appointments. I also discussed with the patient that there may be a patient responsible charge related to this service. The patient expressed understanding and agreed to proceed.   History of Present Illness: Patient was evaluated through phone session.  On his last visit we prescribed low-dose hydroxyzine to help his anxiety as he does not want any medication to take on a regular basis.  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  He reported his job is going very well until recently when city had a curfew and he could not do longer hour and not able to do over time.  However he is pleased that he has working regularly for long time which never had in the past.  He tried hydroxyzine he likes it but only concern is that it makes him sleepy.  However he feel it helps his anxiety and he has taken at least 10 times in past 2 months.  Patient reported he is getting along with his sister, parents and grandfather.  He is not agitated, irritable, anger, having mood swings or any feeling of hopelessness or suicidal thoughts.  He denies any paranoia or any hallucination.  He reported no tremors or shakes.  He denies drinking or using any illegal substances.  He is a still on parole but he reported a very low risk and it may end in another few months.  He still goes once a month therapy at [facility] mandated by court.  His appetite is okay.  His energy level is good.  He is not drinking or using any illegal substances.   Past Psychiatric History:Reviewed. H/Ooppositional defiant disorder, Asperger, ADD andMDD. H/Omultiple hospitalization.Lastadmission in  January 2018 [facility]. H/Ooverdose on Naprosyn.TriedLexapro, Wellbutrin, Fanapt vut d/cdue to noncompliant. Seen Dr. Ladona Ridgel, Dr. Lucianne Muss and Dr. Jarold Motto in this office.  Psychiatric Specialty Exam: Physical Exam  ROS  There were no vitals taken for this visit.There is no height or weight on file to calculate BMI.  General Appearance: NA  Eye Contact:  NA  Speech:  Clear and Coherent  Volume:  Normal  Mood:  Euthymic  Affect:  NA  Thought Process:  Descriptions of Associations: Intact  Orientation:  Full (Time, Place, and Person)  Thought Content:  Logical  Suicidal Thoughts:  No  Homicidal Thoughts:  No  Memory:  Immediate;   Good  Judgement:  Good  Insight:  Good  Psychomotor Activity:  NA  Concentration:  Concentration: Fair and Attention Span: Fair  Recall:  [employer] of Knowledge:  Fair  Language:  Good  Akathisia:  NA  Handed:  Right  AIMS (if indicated):     Assets:  Communication Skills Housing Resilience Social Support Talents/Skills  ADL's:  Intact  Cognition:  WNL  Sleep:   good      Assessment and Plan: Anxiety, Asperger syndrome  Patient is a stable on his current medication which he takes only as needed.  I explained this is the lowest dose we can provide and recommended if it makes sleepy then he can take half tablet.  He like the hydroxyzine because it helps his anxiety.  I discussed medication side effects and benefits.  He like to have another refill in case he needed to take more.  I recommend to call us back if is any question or any concern.  We will follow-up in 4 months however he can call us if symptoms started to get worse.  He is not interested in therapy.    Follow Up Instructions:    I discussed the assessment and treatment plan with the patient. The patient was provided an opportunity to ask questions and all were answered. The patient agreed with the plan and demonstrated an understanding of the instructions.   The patient was  advised to call back or seek an in-person evaluation if the symptoms worsen or if the condition fails to improve as anticipated.  I provided 15 minutes of non-face-to-face time during this encounter.   Cleotis NipperSyed T Tarance Balan, MD

## 2018-10-17 ENCOUNTER — Encounter (HOSPITAL_BASED_OUTPATIENT_CLINIC_OR_DEPARTMENT_OTHER): Payer: Self-pay

## 2018-10-17 ENCOUNTER — Emergency Department (HOSPITAL_BASED_OUTPATIENT_CLINIC_OR_DEPARTMENT_OTHER)
Admission: EM | Admit: 2018-10-17 | Discharge: 2018-10-17 | Disposition: A | Discharge status: Home / Self Care | Payer: No Typology Code available for payment source | Attending: Emergency Medicine | Admitting: Emergency Medicine

## 2018-10-17 ENCOUNTER — Other Ambulatory Visit: Payer: Self-pay

## 2018-10-17 ENCOUNTER — Emergency Department (HOSPITAL_BASED_OUTPATIENT_CLINIC_OR_DEPARTMENT_OTHER): Payer: No Typology Code available for payment source

## 2018-10-17 DIAGNOSIS — F845 Asperger's syndrome: Secondary | ICD-10-CM | POA: Insufficient documentation

## 2018-10-17 DIAGNOSIS — Y999 Unspecified external cause status: Secondary | ICD-10-CM | POA: Insufficient documentation

## 2018-10-17 DIAGNOSIS — T1490XA Injury, unspecified, initial encounter: Secondary | ICD-10-CM

## 2018-10-17 DIAGNOSIS — Y939 Activity, unspecified: Secondary | ICD-10-CM | POA: Insufficient documentation

## 2018-10-17 DIAGNOSIS — S92532B Displaced fracture of distal phalanx of left lesser toe(s), initial encounter for open fracture: Secondary | ICD-10-CM | POA: Diagnosis not present

## 2018-10-17 DIAGNOSIS — Y929 Unspecified place or not applicable: Secondary | ICD-10-CM | POA: Insufficient documentation

## 2018-10-17 DIAGNOSIS — S91205A Unspecified open wound of left lesser toe(s) with damage to nail, initial encounter: Secondary | ICD-10-CM | POA: Diagnosis not present

## 2018-10-17 DIAGNOSIS — W208XXA Other cause of strike by thrown, projected or falling object, initial encounter: Secondary | ICD-10-CM | POA: Insufficient documentation

## 2018-10-17 DIAGNOSIS — S99922A Unspecified injury of left foot, initial encounter: Secondary | ICD-10-CM

## 2018-10-17 MED ORDER — SULFAMETHOXAZOLE-TRIMETHOPRIM 800-160 MG PO TABS
1.0000 | ORAL_TABLET | Freq: Once | ORAL | Status: AC
  Administered 2018-10-17: 1 via ORAL
  Filled 2018-10-17: qty 1

## 2018-10-17 MED ORDER — SULFAMETHOXAZOLE-TRIMETHOPRIM 800-160 MG PO TABS: 1.0000 | ORAL_TABLET | Freq: Two times a day (BID) | ORAL | 0 refills | Status: AC

## 2018-10-17 MED ORDER — IBUPROFEN 400 MG PO TABS
600.0000 mg | ORAL_TABLET | Freq: Once | ORAL | Status: AC
  Administered 2018-10-17: 600 mg via ORAL
  Filled 2018-10-17: qty 1

## 2018-10-17 MED ORDER — HYDROCODONE-ACETAMINOPHEN 5-325 MG PO TABS
1.0000 | ORAL_TABLET | Freq: Once | ORAL | Status: AC
  Administered 2018-10-17: 1 via ORAL
  Filled 2018-10-17: qty 1

## 2018-10-17 MED ORDER — LIDOCAINE HCL (PF) 1 % IJ SOLN
5.0000 mL | Freq: Once | INTRAMUSCULAR | Status: AC
  Administered 2018-10-17: 5 mL
  Filled 2018-10-17: qty 5

## 2018-10-17 MED ORDER — HYDROCODONE-ACETAMINOPHEN 5-325 MG PO TABS: 1.0000 | ORAL_TABLET | Freq: Four times a day (QID) | ORAL | 0 refills | Status: AC | PRN

## 2018-10-17 NOTE — ED Provider Notes (Signed)
[facility] EMERGENCY DEPARTMENT Provider Note   CSN: [id number] Arrival date & time: 10/17/18  1933     History   Chief Complaint Chief Complaint  Patient presents with  . Toe Injury    HPI Terry Hutchinson is a 23 y.o. male with past medical history of Asperger's disorder, presenting to the emergency department with sudden onset of injury to left second toe occurred prior to arrival.  Patient states he dropped about a 40 pound metal core on his left foot.  He was wearing sneakers that had no toe protection.  His shoe was not damaged, however he has an injury to the left second toe including an injury to the nail.  His pain is worse with movement and palpation.  His tetanus was last updated 2 years ago.  No history of immunocompromise.  No medications taken for symptoms prior to arrival.     HPI  Past Medical History:  Diagnosis Date  . ADHD (attention deficit hyperactivity disorder)   . Anemia   . Asperger's disorder   . Depression   . History of chicken pox   . Migraine   . Oppositional defiant disorder     Patient Active Problem List   Diagnosis Date Noted  . Depressive disorder 03/17/2016  . Suicide attempt (HCC) 03/17/2016  . Ingrown toenail 12/28/2015  . Major depressive disorder, recurrent episode (HCC) 05/26/2015  . Suicide attempt by drug ingestion (HCC)   . ADD (attention deficit disorder) 02/20/2011  . Asperger's disorder 02/20/2011  . ODD (oppositional defiant disorder) 02/20/2011    Past Surgical History:  Procedure Laterality Date  . TONSILLECTOMY    . tubes in ears     in the past  . WISDOM TOOTH EXTRACTION          Home Medications    Prior to Admission medications   Medication Sig Start Date End Date Taking? Authorizing Provider  HYDROcodone-acetaminophen (NORCO/VICODIN) 5-325 MG tablet Take 1 tablet by mouth every 6 (six) hours as needed for severe pain. 10/17/18   Maeven Mcdougall, [place] N, PA-C  hydrOXYzine (ATARAX/VISTARIL) 10 MG tablet  Take one to two tab as needed for anxiety 07/23/18   Arfeen, Phillips Grout, MD  sulfamethoxazole-trimethoprim (BACTRIM DS) 800-160 MG tablet Take 1 tablet by mouth 2 (two) times daily for 7 days. 10/18/18 10/25/18  Upton Russey, [place] N, PA-C    Family History Family History  Problem Relation Age of Onset  . Depression Mother   . ADD / ADHD Father   . Alcohol abuse Maternal Grandfather   . Depression Maternal Grandmother     Social History Social History   Tobacco Use  . Smoking status: Never Smoker  . Smokeless tobacco: Never Used  Substance Use Topics  . Alcohol use: No  . Drug use: No     Allergies   Peach [prunus persica], Cefpodoxime, and Vantin   Review of Systems Review of Systems  Musculoskeletal: Positive for arthralgias and joint swelling.  Skin: Positive for color change and wound.  Allergic/Immunologic: Negative for immunocompromised state.  All other systems reviewed and are negative.    Physical Exam Updated Vital Signs BP 137/74 (BP Location: Right Arm)   Pulse 78   Resp 18   SpO2 98%   Physical Exam Vitals signs and nursing note reviewed.  Constitutional:      General: He is not in acute distress.    Appearance: He is well-developed.  HENT:     Head: Normocephalic and atraumatic.  Eyes:  Conjunctiva/sclera: Conjunctivae normal.  Cardiovascular:     Rate and Rhythm: Normal rate.  Pulmonary:     Effort: Pulmonary effort is normal.  Musculoskeletal:     Comments: Left 2nd toe with bruising and swelling noted, mostly to distal digit. The nail root appears to be dislocated from the cuticle and the nail plate also appears disconnected from the nail bed centrally though the nail is still attached to the nail fold on the lateral aspects.  TTP to distal digit. Normal distal sensation, distal toe is warm and pink. No TTP to foot or other toes.  Neurological:     Mental Status: He is alert.  Psychiatric:        Mood and Affect: Mood normal.        Behavior:  Behavior normal.        ED Treatments / Results  Labs (all labs ordered are listed, but only abnormal results are displayed) Labs Reviewed - No data to display  EKG None  Radiology Dg Foot Complete Left  Result Date: 10/17/2018 CLINICAL DATA:  Left second toe injury EXAM: LEFT FOOT - COMPLETE 3+ VIEW COMPARISON:  None. FINDINGS: Acute comminuted fracture involving the tuft of the second distal phalanx with laterally displaced fracture fragment. No subluxation. No radiopaque foreign body IMPRESSION: Acute comminuted and displaced fracture involving the tuft of the second distal phalanx Electronically Signed   By: Donavan Foil M.D.   On: 10/17/2018 20:13    Procedures .Marland KitchenLaceration Repair  Date/Time: 10/17/2018 9:00 PM Performed by: Idonna Heeren, [place] N, PA-C Authorized by: Victoria Euceda, [place] N, PA-C   Consent:    Consent obtained:  Verbal   Consent given by:  Patient   Risks discussed:  Infection and pain (permanent nail damage)   Alternatives discussed:  No treatment Anesthesia (see MAR for exact dosages):    Anesthesia method:  Nerve block   Block anesthetic:  Lidocaine 1% w/o epi   Block injection procedure:  Anatomic landmarks palpated   Block outcome:  Anesthesia achieved Laceration details:    Location:  Toe   Toe location:  L second toe   Wound length (cm): nail repair, no laceration length to document. Repair type:    Repair type:  Simple Pre-procedure details:    Preparation:  Patient was prepped and draped in usual sterile fashion and imaging obtained to evaluate for foreign bodies Exploration:    Hemostasis achieved with:  Direct pressure   Wound exploration: entire depth of wound probed and visualized     Wound extent: underlying fracture     Wound extent: no foreign bodies/material noted     Contaminated: no   Treatment:    Area cleansed with:  Saline and Hibiclens   Amount of cleaning:  Standard   Irrigation solution:  Sterile water   Visualized foreign  bodies/material removed: no   Skin repair:    Repair method:  Steri-Strips   Number of Steri-Strips:  3 Post-procedure details:    Dressing:  Non-adherent dressing (Buddy tape, postop shoe)   Patient tolerance of procedure:  Tolerated well, no immediate complications Comments:     The toe was anesthetized successfully with nerve block.  The nail and wound were cleansed thoroughly with sterile water.  The nail root was then relocated under the cuticle in proper position.  We then used three 1/4 inch Steri-Strips to secure the nail in place.  The toe was then dressed with nonadherent gauze and buddy taped to the third toe.  Postop shoe  for protection.   (including critical care time)  Medications Ordered in ED Medications  HYDROcodone-acetaminophen (NORCO/VICODIN) 5-325 MG per tablet 1 tablet (1 tablet Oral Given 10/17/18 2006)  lidocaine (PF) (XYLOCAINE) 1 % injection 5 mL (5 mLs Infiltration Given by Other 10/17/18 2027)  sulfamethoxazole-trimethoprim (BACTRIM DS) 800-160 MG per tablet 1 tablet (1 tablet Oral Given 10/17/18 2038)  ibuprofen (ADVIL) tablet 600 mg (600 mg Oral Given 10/17/18 2112)     Initial Impression / Assessment and Plan / ED Course  I have reviewed the triage vital signs and the nursing notes.  Pertinent labs & imaging results that were available during my care of the patient were reviewed by me and considered in my medical decision making (see chart for details).  Clinical Course as of Oct 17 2155  Thu Oct 17, 2018  2023 Consulted with orthopedic surgeon, Dr. Everardo PacificVarkey.  He agrees with plan to digital block the toe, replace the nail, and provide referral for outpatient follow-up in about a week.  He recommends patient follow-up with Dr. Susa SimmondsAdair who is part of his practice, however specializes in foot and ankle. Will rx bactrim. Appreciate consult.   [JR]    Clinical Course User Index [JR] Mikiyah Glasner, [place] N, PA-C      Patient presenting with injury to left second toe  after a heavy metal object fell on his foot.  Tetanus is up-to-date.  He has a comminuted fracture of the distal phalanx of the left second digit with associated nail injury.  The nail root appears to be this lodged from the cuticle though the nail plate is still attached to the nail folds on both lateral edges.  Consulted with orthopedic surgeon, Dr. Everardo PacificVarkey, who agrees with plan to perform digital block at bedside and relocate the nail to proper position with nail root under the cuticle.  This was done at bedside without complication, Steri-Strips were applied to help secure the nail.  The toe was then buddy taped, and postop shoe applied.  Patient was provided crutches.  Pain is adequately managed in the ED.  He will be discharged with pain medication, symptomatic management, Bactrim, and referral to orthopedics for follow-up in 1 week.  No history of immunocompromise to affect wound healing.  Patient and his father are agreeable to plan, patient is safe for discharge.  Patient discussed with Dr. Charm BargesButler.  [place] reporting System queried  Discussed results, findings, treatment and follow up. Patient advised of return precautions. Patient verbalized understanding and agreed with plan.   Final Clinical Impressions(s) / ED Diagnoses   Final diagnoses:  Open displaced fracture of distal phalanx of lesser toe of left foot, initial encounter  Injury of toenail of left foot, initial encounter    ED Discharge Orders         Ordered    sulfamethoxazole-trimethoprim (BACTRIM DS) 800-160 MG tablet  2 times daily     10/17/18 2113    HYDROcodone-acetaminophen (NORCO/VICODIN) 5-325 MG tablet  Every 6 hours PRN     10/17/18 2113           Ahni Bradwell, [place] N, PA-C 10/17/18 2158    Terrilee FilesButler, Michael C, MD 10/17/18 2232

## 2018-10-17 NOTE — Discharge Instructions (Signed)
Keep your bandage on for the next 2 days, unless he gets very wet or dirty. The Steri-Strips will fall off on their own.  Keep your wound clean. Elevate your foot as much as possible to help with pain and swelling. Wear the shoe, and avoid weightbearing for at least 1 week or until cleared by the orthopedic doctor. Call the orthopedic office to schedule an appointment for follow-up in 1 week. Starting tomorrow morning, take the antibiotic, Bactrim, as prescribed until gone. You can take 600 mg of ibuprofen every 6 hours as needed for pain and swelling. You can take the hydrocodone every 6 hours as needed for severe pain.  Be aware there is Tylenol this medication.  Be aware this medication can make you drowsy, do not drive or drink alcohol taking this medication. Return to the ER for fever, pus draining from your wound, or new or concerning symptoms.

## 2018-10-17 NOTE — ED Triage Notes (Signed)
Pt states he dropped "a metal core" on his left great toe at 604pm at [facility] triage in w/c-NAD

## 2018-11-21 ENCOUNTER — Other Ambulatory Visit: Payer: Self-pay

## 2018-11-21 ENCOUNTER — Ambulatory Visit (HOSPITAL_COMMUNITY): Payer: BC Managed Care – PPO | Admitting: Psychiatry

## 2019-04-27 ENCOUNTER — Ambulatory Visit: Payer: Self-pay | Attending: Internal Medicine

## 2019-04-27 DIAGNOSIS — Z23 Encounter for immunization: Secondary | ICD-10-CM | POA: Insufficient documentation

## 2019-04-27 NOTE — Progress Notes (Signed)
   Covid-19 Vaccination Clinic  Name:  Terry Hutchinson    MRN: [id number] DOB: December 28, 1995  04/27/2019  [name] was observed post Covid-19 immunization for 15 minutes without incident. He was provided with Vaccine Information Sheet and instruction to access the V-Safe system.   [name] was instructed to call 911 with any severe reactions post vaccine: Marland Kitchen Difficulty breathing  . Swelling of face and throat  . A fast heartbeat  . A bad rash all over body  . Dizziness and weakness   Immunizations Administered    Name Date Dose VIS Date Route   Pfizer COVID-19 Vaccine 04/27/2019  8:41 AM 0.3 mL 01/31/2019 Intramuscular   Manufacturer: [employer], [employer]   Lot: [id number]   NDC: [address]-6

## 2019-05-12 ENCOUNTER — Ambulatory Visit: Payer: Self-pay

## 2019-05-27 ENCOUNTER — Ambulatory Visit: Payer: Self-pay | Attending: Internal Medicine

## 2019-05-27 DIAGNOSIS — Z23 Encounter for immunization: Secondary | ICD-10-CM

## 2019-05-27 NOTE — Progress Notes (Signed)
   Covid-19 Vaccination Clinic  Name:  Terry Hutchinson    MRN: [id number] DOB: 12-May-1995  05/27/2019  Mr. Terry Hutchinson was observed post Covid-19 immunization for 15 minutes without incident. He was provided with Vaccine Information Sheet and instruction to access the V-Safe system.   Mr. Terry Hutchinson was instructed to call 911 with any severe reactions post vaccine: Marland Kitchen Difficulty breathing  . Swelling of face and throat  . A fast heartbeat  . A bad rash all over body  . Dizziness and weakness   Immunizations Administered    Name Date Dose VIS Date Route   Pfizer COVID-19 Vaccine 05/27/2019 10:42 AM 0.3 mL 01/31/2019 Intramuscular   Manufacturer: [employer], [employer]   Lot: [id number]   NDC: [address]-5

## 2019-12-13 IMAGING — DX LEFT FOOT - COMPLETE 3+ VIEW
4 series · 4 of 4 positions shown · non-contrast
Comparison: None.

CLINICAL DATA: Left second toe injury

EXAM:
LEFT FOOT - COMPLETE 3+ VIEW

[foot ap]
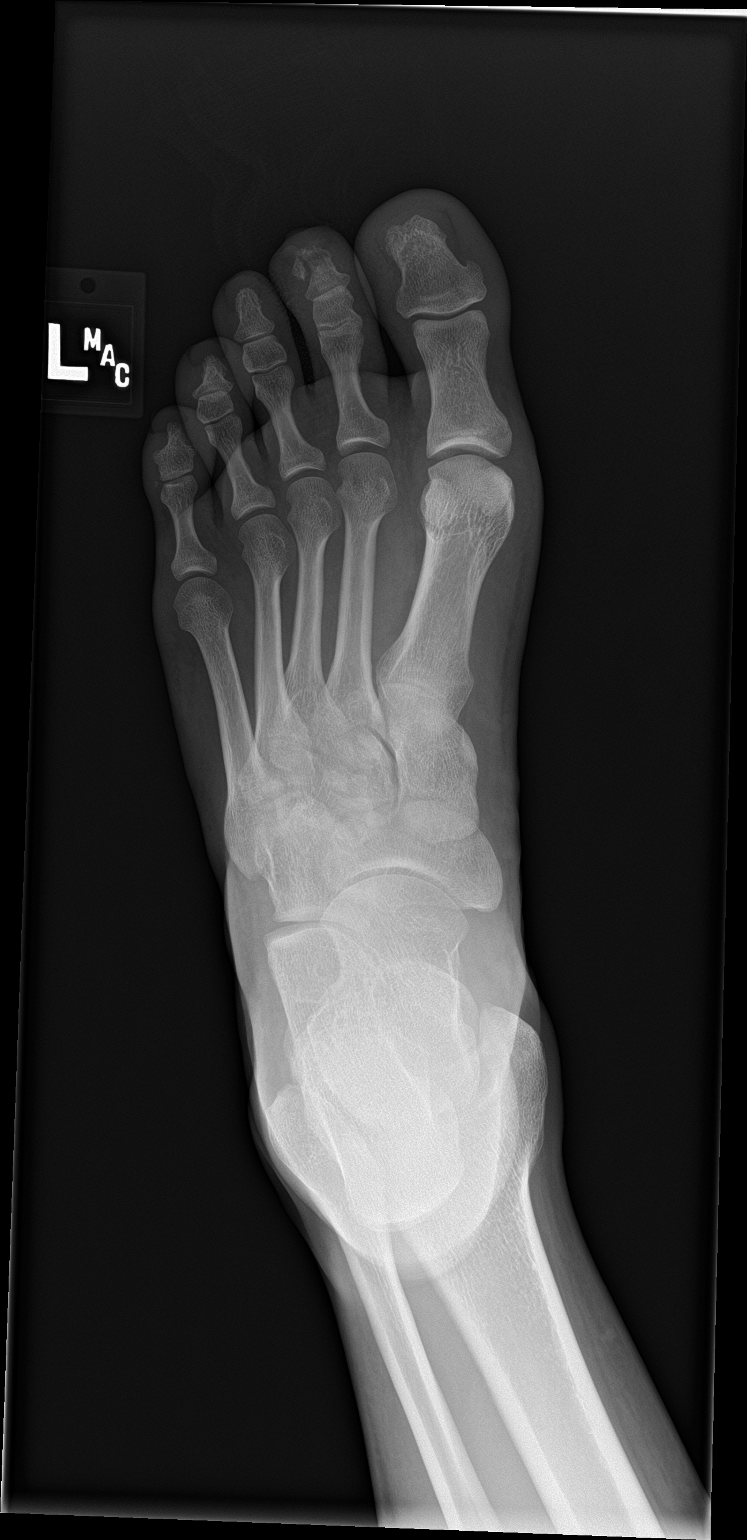

[foot obl]
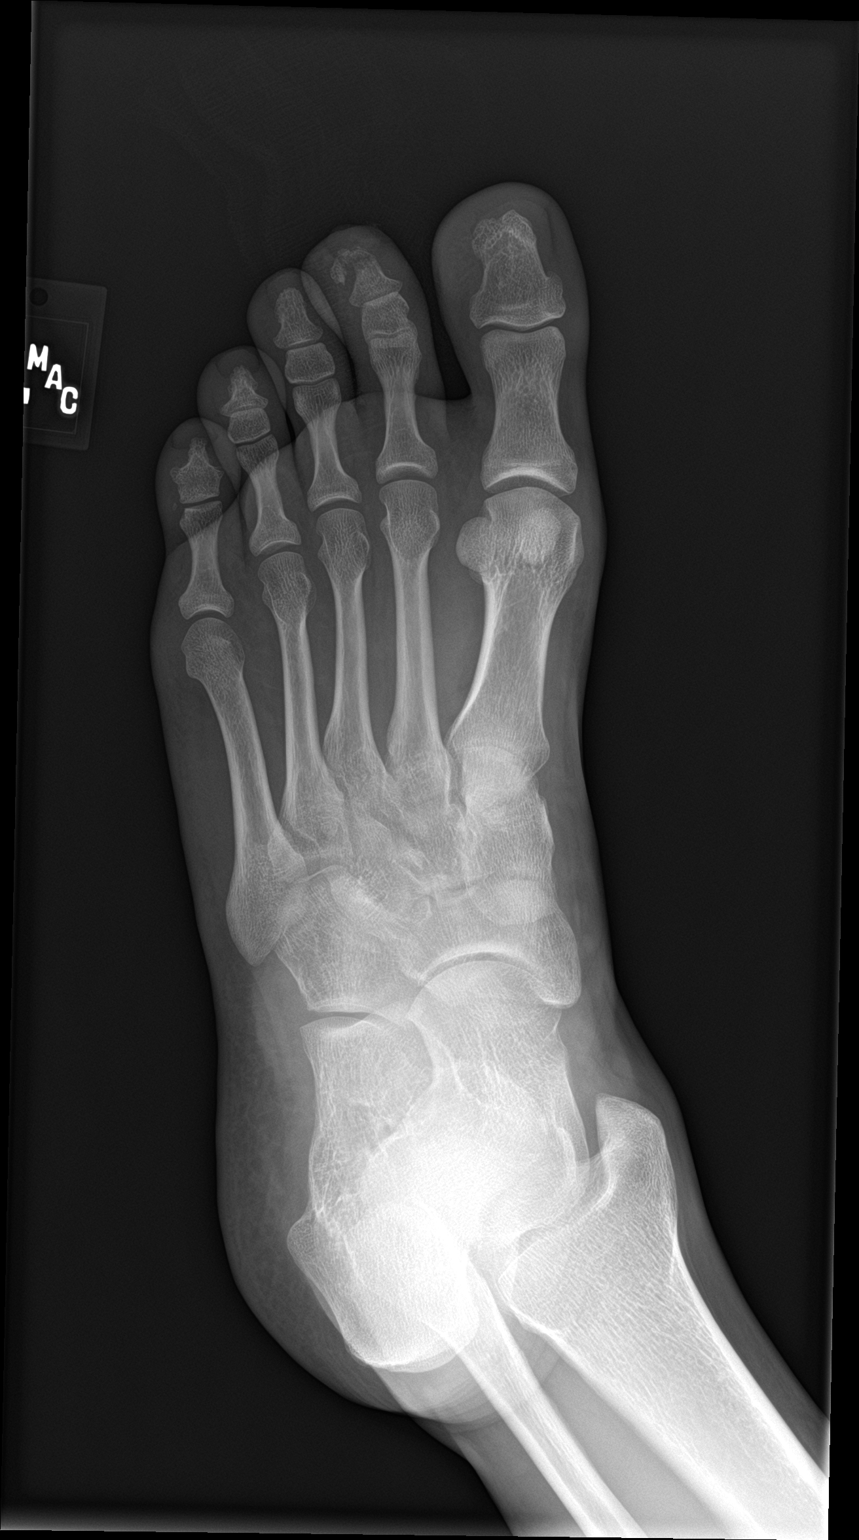

[foot lat (1 of 2)]
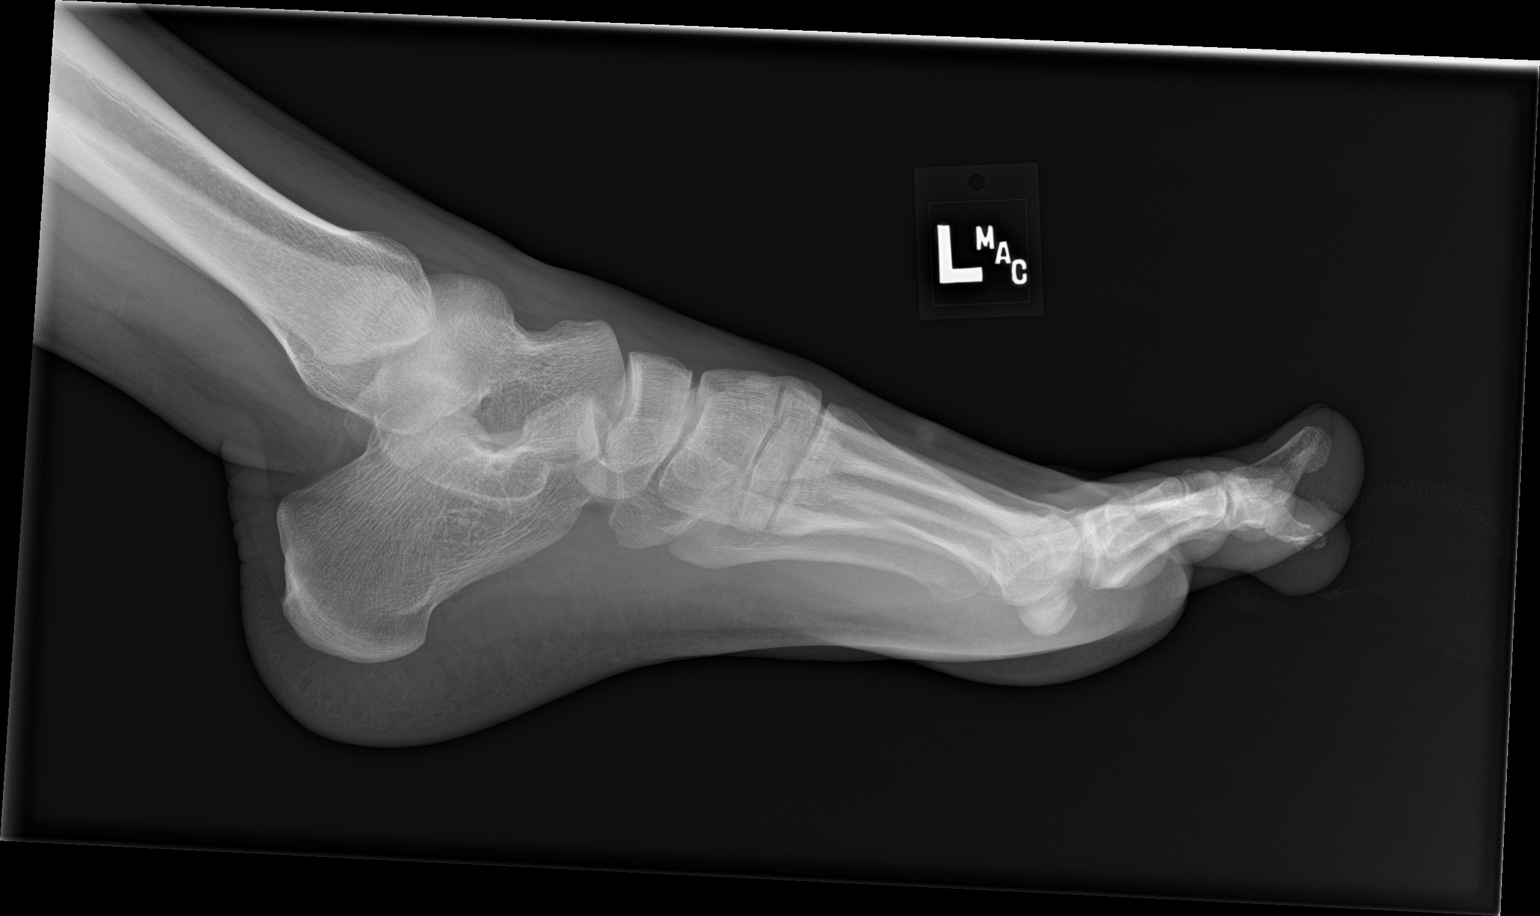

[foot lat (2 of 2)]
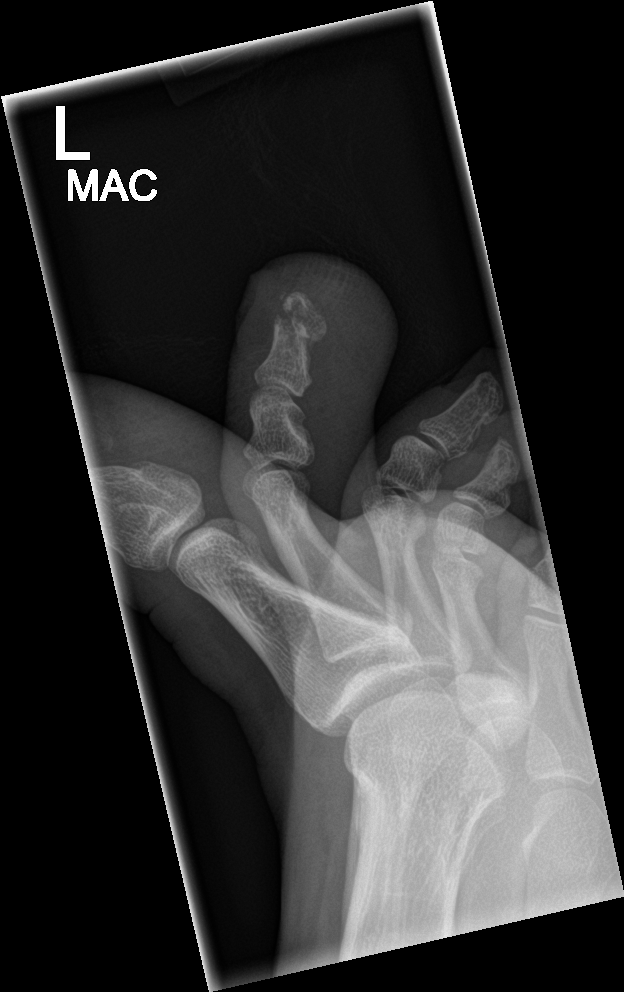

[4 of 4 positions shown; findings below may reference images not displayed]

FINDINGS: Acute comminuted fracture involving the tuft of the second distal
phalanx with laterally displaced fracture fragment. No subluxation.
No radiopaque foreign body
IMPRESSION: Acute comminuted and displaced fracture involving the tuft of the
second distal phalanx

## 2023-07-02 ENCOUNTER — Other Ambulatory Visit (HOSPITAL_COMMUNITY): Payer: Self-pay

## 2023-07-02 ENCOUNTER — Telehealth: Payer: Self-pay

## 2023-07-02 NOTE — Telephone Encounter (Signed)
 Pharmacy Patient Advocate Encounter  Insurance verification completed.   The patient is insured through [facility]   Ran test claim for Descovy. Currently a quantity of 30 is a 30 day supply and the co-pay is 0.00 . Apretude will need a PA.  This test claim was processed through [facility]- copay amounts may vary at other pharmacies due to pharmacy/plan contracts, or as the patient moves through the different stages of their insurance plan.

## 2023-07-06 ENCOUNTER — Ambulatory Visit: Admitting: Pharmacist
